# Patient Record
Sex: Female | Born: 1975 | Race: White | Hispanic: No | Marital: Single | Smoking: Current every day smoker
Health system: Southern US, Community
[De-identification: ages and names within clinical notes are randomized; demographics above are authoritative.]

## PROBLEM LIST (undated history)

## (undated) DIAGNOSIS — D496 Neoplasm of unspecified behavior of brain: Secondary | ICD-10-CM

## (undated) DIAGNOSIS — R569 Unspecified convulsions: Secondary | ICD-10-CM

## (undated) DIAGNOSIS — M549 Dorsalgia, unspecified: Secondary | ICD-10-CM

## (undated) DIAGNOSIS — F329 Major depressive disorder, single episode, unspecified: Secondary | ICD-10-CM

## (undated) DIAGNOSIS — I509 Heart failure, unspecified: Secondary | ICD-10-CM

## (undated) DIAGNOSIS — I10 Essential (primary) hypertension: Secondary | ICD-10-CM

## (undated) DIAGNOSIS — F32A Depression, unspecified: Secondary | ICD-10-CM

## (undated) HISTORY — PX: OTHER SURGICAL HISTORY: SHX169

## (undated) HISTORY — PX: TONSILLECTOMY: SUR1361

## (undated) HISTORY — PX: CHOLECYSTECTOMY: SHX55

## (undated) HISTORY — PX: BACK SURGERY: SHX140

## (undated) HISTORY — PX: BREAST LUMPECTOMY: SHX2

## (undated) HISTORY — PX: OVARIAN CYST SURGERY: SHX726

## (undated) HISTORY — PX: BRAIN SURGERY: SHX531

---

## 2013-09-12 ENCOUNTER — Emergency Department (HOSPITAL_COMMUNITY): Payer: Medicaid - Out of State

## 2013-09-12 ENCOUNTER — Encounter (HOSPITAL_COMMUNITY): Payer: Self-pay | Admitting: Emergency Medicine

## 2013-09-12 ENCOUNTER — Emergency Department (HOSPITAL_COMMUNITY)
Admission: EM | Admit: 2013-09-12 | Discharge: 2013-09-12 | Disposition: A | Discharge status: Home / Self Care | Payer: Medicaid - Out of State | Attending: Emergency Medicine | Admitting: Emergency Medicine

## 2013-09-12 DIAGNOSIS — I10 Essential (primary) hypertension: Secondary | ICD-10-CM | POA: Insufficient documentation

## 2013-09-12 DIAGNOSIS — F17200 Nicotine dependence, unspecified, uncomplicated: Secondary | ICD-10-CM | POA: Insufficient documentation

## 2013-09-12 DIAGNOSIS — Z760 Encounter for issue of repeat prescription: Secondary | ICD-10-CM | POA: Insufficient documentation

## 2013-09-12 DIAGNOSIS — E669 Obesity, unspecified: Secondary | ICD-10-CM | POA: Insufficient documentation

## 2013-09-12 DIAGNOSIS — Z9889 Other specified postprocedural states: Secondary | ICD-10-CM | POA: Insufficient documentation

## 2013-09-12 DIAGNOSIS — M549 Dorsalgia, unspecified: Secondary | ICD-10-CM | POA: Insufficient documentation

## 2013-09-12 DIAGNOSIS — Z91040 Latex allergy status: Secondary | ICD-10-CM | POA: Insufficient documentation

## 2013-09-12 DIAGNOSIS — G930 Cerebral cysts: Secondary | ICD-10-CM | POA: Insufficient documentation

## 2013-09-12 DIAGNOSIS — F411 Generalized anxiety disorder: Secondary | ICD-10-CM | POA: Insufficient documentation

## 2013-09-12 DIAGNOSIS — I509 Heart failure, unspecified: Secondary | ICD-10-CM | POA: Insufficient documentation

## 2013-09-12 DIAGNOSIS — Z880 Allergy status to penicillin: Secondary | ICD-10-CM | POA: Insufficient documentation

## 2013-09-12 DIAGNOSIS — G8929 Other chronic pain: Secondary | ICD-10-CM | POA: Insufficient documentation

## 2013-09-12 HISTORY — DX: Essential (primary) hypertension: I10

## 2013-09-12 HISTORY — DX: Dorsalgia, unspecified: M54.9

## 2013-09-12 HISTORY — DX: Unspecified convulsions: R56.9

## 2013-09-12 HISTORY — DX: Heart failure, unspecified: I50.9

## 2013-09-12 HISTORY — DX: Neoplasm of unspecified behavior of brain: D49.6

## 2013-09-12 LAB — DIFFERENTIAL
Basophils Absolute: 0.1 K/uL (ref 0.0–0.1)
Basophils Relative: 1 % (ref 0–1)
Eosinophils Absolute: 0.3 K/uL (ref 0.0–0.7)
Eosinophils Relative: 5 % (ref 0–5)
Lymphocytes Relative: 33 % (ref 12–46)
Lymphs Abs: 2.3 K/uL (ref 0.7–4.0)
Monocytes Absolute: 0.4 K/uL (ref 0.1–1.0)
Monocytes Relative: 5 % (ref 3–12)
Neutro Abs: 3.9 K/uL (ref 1.7–7.7)
Neutrophils Relative %: 56 % (ref 43–77)

## 2013-09-12 LAB — COMPREHENSIVE METABOLIC PANEL
ALBUMIN: 3.7 g/dL (ref 3.5–5.2)
ALT: 15 U/L (ref 0–35)
AST: 13 U/L (ref 0–37)
Alkaline Phosphatase: 133 U/L — ABNORMAL HIGH (ref 39–117)
BUN: 15 mg/dL (ref 6–23)
CALCIUM: 10.8 mg/dL — AB (ref 8.4–10.5)
CO2: 21 mEq/L (ref 19–32)
Chloride: 103 mEq/L (ref 96–112)
Creatinine, Ser: 0.86 mg/dL (ref 0.50–1.10)
GFR calc Af Amer: >90 mL/min (ref >90)
GFR calc non Af Amer: 85 mL/min — ABNORMAL LOW (ref >90)
Glucose, Bld: 86 mg/dL (ref 70–99)
Potassium: 4.9 mEq/L (ref 3.7–5.3)
SODIUM: 136 meq/L — AB (ref 137–147)
TOTAL PROTEIN: 7.5 g/dL (ref 6.0–8.3)
Total Bilirubin: 0.4 mg/dL (ref 0.3–1.2)

## 2013-09-12 LAB — CBC
HEMATOCRIT: 43.3 % (ref 36.0–46.0)
Hemoglobin: 14.9 g/dL (ref 12.0–15.0)
MCH: 32.2 pg (ref 26.0–34.0)
MCHC: 34.4 g/dL (ref 30.0–36.0)
MCV: 93.5 fL (ref 78.0–100.0)
PLATELETS: 211 10*3/uL (ref 150–400)
RBC: 4.63 MIL/uL (ref 3.87–5.11)
RDW: 12.8 % (ref 11.5–15.5)
WBC: 7 10*3/uL (ref 4.0–10.5)

## 2013-09-12 LAB — PROTIME-INR
INR: 0.99 (ref 0.00–1.49)
PROTHROMBIN TIME: 12.9 s (ref 11.6–15.2)

## 2013-09-12 LAB — APTT: APTT: 28 s (ref 24–37)

## 2013-09-12 MED ORDER — PHENYTOIN 50 MG PO CHEW
100.0000 mg | CHEWABLE_TABLET | ORAL | Status: AC
  Administered 2013-09-12: 100 mg via ORAL
  Filled 2013-09-12: qty 2

## 2013-09-12 MED ORDER — HYDROCODONE-ACETAMINOPHEN 5-325 MG PO TABS
1.0000 | ORAL_TABLET | Freq: Once | ORAL | Status: AC
  Administered 2013-09-12: 1 via ORAL
  Filled 2013-09-12: qty 1

## 2013-09-12 MED ORDER — LISINOPRIL 20 MG PO TABS: 20.0000 mg | ORAL_TABLET | Freq: Every day | ORAL | Status: DC

## 2013-09-12 MED ORDER — ACETAMINOPHEN 325 MG PO TABS: 650.0000 mg | ORAL_TABLET | Freq: Once | ORAL | Status: DC

## 2013-09-12 MED ORDER — SIMVASTATIN 40 MG PO TABS: 40.0000 mg | ORAL_TABLET | Freq: Every day | ORAL | Status: DC

## 2013-09-12 MED ORDER — PHENYTOIN SODIUM EXTENDED 100 MG PO CAPS: 100.0000 mg | ORAL_CAPSULE | Freq: Three times a day (TID) | ORAL | Status: DC
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
## 2013-09-12 NOTE — ED Notes (Signed)
Pt and SO expressing concerns about pt going home tonight.  States they are staying in motel and area not safe as pt is having difficulties walking and will have problem getting medications until at least several days.

## 2013-09-12 NOTE — Discharge Instructions (Signed)
Arachnoid Cysts Arachnoid cysts are cerebrospinal fluid-filled sacs. They may develop between the surface of the brain and the cranial base or on the arachnoid membrane. This is one of the 3 membranes that cover the brain and the spinal cord. Most cases begin during infancy. But onset may be delayed until adolescence. These cysts most often happen in males. SYMPTOMS  Problems (symptoms ) of an arachnoid cyst are related to the cyst size and location.  Small cysts are usually asymptomatic. This means they cause no symptoms. They are discovered only by chance.  Large cysts may cause cranial deformation or enlargement of the head (macrocephaly). These produce symptoms such as:  Headaches.  Convulsions (seizures).  Developmental delay.  Increased intracranial pressure.  Behavioral changes.  Excessive accumulation of cerebrospinal fluid (hydrocephalus). Other symptoms may include:  Weakness or paralysis on one side of the body (hemiparesis).  Lack of muscle control (ataxia). TREATMENT  Treatment for arachnoid cysts is symptomatic. When symptoms warrant, the surgical placement of a shunt may be required to remove pressure from (decompress) the cyst. If left untreated, arachnoid cysts may cause permanent, severe neurological damage. This is due to the progressive expansion of the cyst(s) or bleeding (hemorrhage). With treatment most individuals with arachnoid cysts do well. Document Released: 06/12/2002 Document Revised: 09/14/2011 Document Reviewed: 06/22/2005 [facility] Patient Information 2014 [place].   Emergency Department Resource Guide 1) Find a Doctor and Pay Out of Pocket Although you won't have to find out who is covered by your insurance plan, it is a good idea to ask around and get recommendations. You will then need to call the office and see if the doctor you have chosen will accept you as a new patient and what types of options they offer for patients who are self-pay.  Some doctors offer discounts or will set up payment plans for their patients who do not have insurance, but you will need to ask so you aren't surprised when you get to your appointment.  2) Contact Your Local Health Department Not all health departments have doctors that can see patients for sick visits, but many do, so it is worth a call to see if yours does. If you don't know where your local health department is, you can check in your phone book. The CDC also has a tool to help you locate your state's health department, and many state websites also have listings of all of their local health departments.  3) Find a [facility] If your illness is not likely to be very severe or complicated, you may want to try a walk in clinic. These are popping up all over the country in pharmacies, drugstores, and shopping centers. They're usually staffed by nurse practitioners or physician assistants that have been trained to treat common illnesses and complaints. They're usually fairly quick and inexpensive. However, if you have serious medical issues or chronic medical problems, these are probably not your best option.  No Primary Care Doctor: - Call Health Connect at  [phone] - they can help you locate a primary care doctor that  accepts your insurance, provides certain services, etc. - Physician Referral Service- [phone]  Chronic Pain Problems: Organization         Address  Phone   Notes  [facility]  [phone] Patients need to be referred by their primary care doctor.   Medication Assistance: Organization         Address  Phone   Notes  [facility]  Medication Assistance Program [address] [place], [place] [address] [phone] --Must be a resident of [facility] [place] -- Must have NO insurance coverage whatsoever (no Medicaid/ Medicare, etc.) -- The pt. MUST have a primary care doctor that directs their care regularly and follows them in the  community   MedAssist  [phone]   [employer]  [phone]    Agencies that provide inexpensive medical care: Organization         Address  Phone   Notes  [facility]  [phone]   [facility]    [phone]   [facility] [address] [place], [place] [address] [phone]   [facility] [address], [place] [phone]   Planned Parenthood    [phone]   [facility]    [phone]   [facility]  [address], [employer] Phone:  [phone], Fax:  [phone] Hours of Operation:  9 am - 6 pm, M-F.  Also accepts Medicaid/Medicare and self-pay.  [facility]  [address], [place] Phone: [phone]) [phone], Fax: [phone]) [phone]. Hours of Operation:  8:30 am - 5:30 pm, M-F.  Also accepts Medicaid and self-pay.  [facility] [place] [address], [place] Phone: [phone]) [phone]   [facility] [address], [place], [place] [phone], Ext. [phone] Mondays & Thursdays: 7-9 AM.  First 15 patients are seen on a first come, first serve basis.    Medicaid-accepting [facility] Providers:  Organization         Address  Phone   Notes  [facility] [address], [place] [phone]) [phone] Also accepts self-pay patients.  [facility] [address], [place]  [phone]   [facility] [address], [place] [phone]   [facility] [address] [place] [address], [place] [phone]   Renaye RakersVeita Bland [address], [place]   [phone] Only accepts [place] Access [place] patients after they have their name applied to their card.   Self-Pay (no insurance) in [facility]:  Organization         Address  Phone   Notes  Sickle Cell Patients, [facility] [address], [place] [phone]   [facility] [address], [place] [phone]) [phone]   [facility] [address]  [address] [place] [address], [place] [phone]   [facility]/Dr. Osei-Bonsu  [address] [place] [address], [place] or [phone], [place] [phone] Phone number for both [place] and [place] locations is the same.  [facility] [address], [place] [phone]) [phone]   [facility] [place] [address] [place] [address], [place] or [address] [phone]   [facility] [address], [place] [phone]) [phone], phone; [phone]) [phone], fax Sees patients 1st and 3rd Saturday of every month.  Must not qualify for public or private insurance (i.e. Medicaid, Medicare, [place] Health Choice, Veterans' Benefits)  Household income should be no more than 200% of the poverty level The clinic cannot treat you if you are pregnant or think you are pregnant  Sexually transmitted diseases are not treated  at the clinic.   Dental Care: Organization         Address  Phone  Notes  [facility] of [facility] [address] [place], [place] [phone] Accepts children up to age 36 who are enrolled in [place] or [place] Health Choice; pregnant women with a Medicaid card; and children who have applied for Medicaid or [place] Health Choice, but were declined, whose parents can pay a reduced fee at time of service.  [facility] Department of [facility]  [address], [place] [phone] Accepts children up to age 31 who are enrolled in [place] or [place] Health Choice; pregnant women with a Medicaid card; and children who have applied for Medicaid or [place] Health Choice, but were declined, whose parents can pay a reduced fee at time of service.  [facility]  [address] [place], [place] [phone] Patients are seen by appointment only. Walk-ins are not accepted. [facility] will see patients 57 years of age and older. Monday - Tuesday (8am-5pm) Most Wednesdays (8:30-5pm) $30 per visit, cash only  [facility]  [address], [facility] [phone] Patients are seen by appointment only. Walk-ins are not accepted. [facility] will see patients 59 years of age and older. One Wednesday Evening (Monthly: Volunteer Based).  $30 per visit, cash only  [employer]  [phone] for adults; Children under age 35, call Graduate Pediatric Dentistry at [phone]. Children aged 59-14, please call [phone] to request a pediatric application.  Dental services are provided in all areas of dental care including fillings, crowns and bridges, complete and partial dentures, implants, gum treatment, root canals, and extractions. Preventive care is also provided. Treatment is provided to both adults and children. Patients are selected via a lottery and there is often a waiting list.   [facility] [address] [place]  [phone] [email or web address]   [facility] [address] [place], [place] [phone], Ext. [phone] Second and Fourth Thursday of each month, opens at 6:30 AM; Clinic ends at 9 AM.  Patients are seen on a first-come first-served basis, and a limited number are seen during each clinic.   [facility]  [address] [place], [place] [phone]   Eligibility Requirements You must have lived in [place], [place], or [place] counties for at least the last three months.   You cannot be eligible for state or federal sponsored [place], including [employer], [place], or [employer].   You generally cannot be eligible for healthcare insurance through your employer.    How to apply: Eligibility screenings are held every  Tuesday and Wednesday afternoon from 1:00 pm until 4:00 pm. You do not need an appointment for the interview!  [facility] [address], [place], [place] [phone]   [facility] Health Department  [phone]   [facility] Health Department  [phone]   [facility] Health Department  [phone]    Behavioral Health Resources in the Community: Intensive Outpatient Programs Organization         Address  Phone  Notes  [facility] Services [address] [place], [place] [phone]   [facility] Outpatient [address] [place], [place] [phone]   [facility]: [facility] Drug S[facility]cs [address], [place], [place]  [phone]   [facility]  Health [address]  [place], [place] or [phone]   Substance Abuse Resources Organization         Address  Phone  Notes  Alcohol and Drug Services  [phone]   [place]  [phone]   The [place]   Chinita Pester  [phone]   Residential & Outpatient Substance Abuse Program  [phone]   Psychological Services Organization         Address  Phone  Notes  [facility] [place]  [place]  [phone]   [place] [address] [place] or [phone]    Mobile Crisis Teams Organization         Address  Phone  Notes  [facility], Mobile Crisis Care Unit  [phone]   [facility]  [address] [place], [place]   [place] Levels [address], [place] [phone]    Self-Help/Support Groups Organization         Address  Phone             Notes  [place]. of [place] - variety of support groups  [place] Call for more information  Narcotics Anonymous (NA), [facility] [address], [employer] [place]  2 meetings at this location    Special educational needs teacher         Address  Phone  Notes  ASAP Residential Treatment [place],    [place]  [phone]   [facility]  [address] [place] [phone], [place], [place]   [place], [place] [phone] Admissions: 8am-3pm M-F  Incentives Substance [place] [address]    [place], [place] [phone]   [facility] [address] [place], [place], [place]   [facility] [address] [place] [address]  [place], [place]   Insight Programs - Intensive Outpatient [address], [place], [place]   [facility] ([place].,  [place], [place] [phone] or [phone]   [facility] (RTS) [address] [place], [place] Accepts Medicaid  [facility] [place] [address],  [place] [phone] Substance Abuse/Addiction Treatment   [facility] Organization         Address  Phone  Notes  [facility]  [phone]   Domenic Schwab, PhD [address] [place]lis [place]a [place], [place]   [phone] or [phone]   [place]brook Pillager [place], [place] [phone]   [facility] [address], [place], [place] [phone] Insurance/Medicaid/sponsorship through [facility] and Families [address] [place]                                    [place], [place] [phone] [place] [address] [place], [place] [phone]    Dr. Adele Schilder  [phone]   [facility] Dept. 1) [address] [place] 2) [place] 3)  [address], [place] [phone]  [phone]   [place] [phone] or [phone] (After  Hours)

## 2013-09-12 NOTE — ED Notes (Signed)
Pt laying on right side, answers questions appropriately.

## 2013-09-12 NOTE — ED Notes (Signed)
Per pt and friend sts pt has a brain tumor and for the last few days has been experiencing pressure in her head and disorientation and speech issues. sts she has had the brain tumors for a long time. Normally pt has clear speech but has periods of episodes like this.

## 2013-09-12 NOTE — ED Notes (Signed)
Discussed case manager regarding homeless shelters in town.  Per case manage homeless shelters usually fill up quickly.  Advised pt and SO same, they state if they can make it until tomorrow they can someone to wire them money for a new place to stay.

## 2013-09-12 NOTE — ED Notes (Signed)
Case manager at bedside 

## 2013-09-12 NOTE — ED Notes (Signed)
IV attempt x 1, unsuccessful.  Raquel Sarna RN in room, not able to see viable vein.  IV team called.

## 2013-09-12 NOTE — ED Notes (Signed)
Pt is now talking and aware of surroundings.  Pt now being transported to CT.  IV team called back, will come see pt for labs and IV when pt returns from CT.

## 2013-09-12 NOTE — ED Notes (Signed)
Lab tech called nurse to room.  Pts SO states "she is having another spell".  Pt not wanting to verbalize and eyes looking around room.  MD made aware.

## 2013-09-12 NOTE — Progress Notes (Addendum)
ED CM consulted to meet with patient concerning medication issues. Pt presented to [facility] ED reporting running out of her seizure meds. In room to speak with patient. SO at bedside patient consented to discuss her care in his presence. Pt reports recently relocating from [place]. Her Medicaid has not been transferred to [facility] yet. Pt is currently staying in a [facility]el. However reports that they are not able to pay for their room tonight. SW made aware emergency homeless shelter resources given.  Pt is eligible for MATCH. Discussed MATCH program and the guidelines including the  $3 co-pay per prescription. Pt enrolled and [place] letter printed and given to patient with list of participating pharmacies.  Pt verbalized understanding and is agreeable with plan. Discussed disposition plan with Florentina Jenny RN and Dr. Tomi Bamberger  both in agreement with disposition plan. Patient has prescriptions with [place] letter to take to pharmacy. Pt voiced her appreciation. Reminded patient of the importance of following up with a PCP. Affordable Primary care list and [facility] List given. Pt interested in [facility] for f/u. Provided [facility] pamphlet  with contact information on establishing f/u care. Pt verbalized understanding teach back method used. No further ED CM needs identified.

## 2013-09-13 ENCOUNTER — Emergency Department (HOSPITAL_COMMUNITY)
Admission: EM | Admit: 2013-09-13 | Discharge: 2013-09-13 | Disposition: A | Discharge status: Home / Self Care | Payer: Medicaid - Out of State | Attending: Emergency Medicine | Admitting: Emergency Medicine

## 2013-09-13 ENCOUNTER — Encounter (HOSPITAL_COMMUNITY): Payer: Self-pay | Admitting: Emergency Medicine

## 2013-09-13 ENCOUNTER — Emergency Department (HOSPITAL_COMMUNITY): Payer: Self-pay

## 2013-09-13 DIAGNOSIS — R519 Headache, unspecified: Secondary | ICD-10-CM

## 2013-09-13 DIAGNOSIS — F17200 Nicotine dependence, unspecified, uncomplicated: Secondary | ICD-10-CM | POA: Insufficient documentation

## 2013-09-13 DIAGNOSIS — R51 Headache: Secondary | ICD-10-CM | POA: Insufficient documentation

## 2013-09-13 DIAGNOSIS — Z79899 Other long term (current) drug therapy: Secondary | ICD-10-CM | POA: Insufficient documentation

## 2013-09-13 DIAGNOSIS — R5381 Other malaise: Secondary | ICD-10-CM | POA: Insufficient documentation

## 2013-09-13 DIAGNOSIS — I10 Essential (primary) hypertension: Secondary | ICD-10-CM | POA: Insufficient documentation

## 2013-09-13 DIAGNOSIS — Z91040 Latex allergy status: Secondary | ICD-10-CM | POA: Insufficient documentation

## 2013-09-13 DIAGNOSIS — R5383 Other fatigue: Secondary | ICD-10-CM

## 2013-09-13 DIAGNOSIS — H53149 Visual discomfort, unspecified: Secondary | ICD-10-CM | POA: Insufficient documentation

## 2013-09-13 DIAGNOSIS — R209 Unspecified disturbances of skin sensation: Secondary | ICD-10-CM | POA: Insufficient documentation

## 2013-09-13 DIAGNOSIS — I509 Heart failure, unspecified: Secondary | ICD-10-CM | POA: Insufficient documentation

## 2013-09-13 DIAGNOSIS — G40909 Epilepsy, unspecified, not intractable, without status epilepticus: Secondary | ICD-10-CM | POA: Insufficient documentation

## 2013-09-13 DIAGNOSIS — Z880 Allergy status to penicillin: Secondary | ICD-10-CM | POA: Insufficient documentation

## 2013-09-13 MED ORDER — TRAMADOL HCL 50 MG PO TABS
50.0000 mg | ORAL_TABLET | Freq: Once | ORAL | Status: AC
  Administered 2013-09-13: 50 mg via ORAL
  Filled 2013-09-13: qty 1

## 2013-09-13 MED ORDER — TRAMADOL HCL 50 MG PO TABS: 50.0000 mg | ORAL_TABLET | Freq: Four times a day (QID) | ORAL | Status: DC | PRN

## 2013-09-13 MED ORDER — METOCLOPRAMIDE HCL 10 MG PO TABS: 10.0000 mg | ORAL_TABLET | Freq: Four times a day (QID) | ORAL | Status: DC | PRN

## 2013-09-13 MED ORDER — METOCLOPRAMIDE HCL 10 MG PO TABS
10.0000 mg | ORAL_TABLET | Freq: Once | ORAL | Status: AC
  Administered 2013-09-13: 10 mg via ORAL
  Filled 2013-09-13: qty 1

## 2013-09-13 NOTE — ED Notes (Addendum)
Pt states that she was hear earlier tonight for the same. Pt states she has been having a headache for several days.  Pt states she was having weakness on her left side earlier, but not currently.  Pt had CT completed with previous visit.

## 2013-09-13 NOTE — ED Notes (Signed)
When asked to squeeze RN's hands for neuro assessment, patient could not squeeze with left hand, however, patient has freedom of movement in all extremities and is using phone with left hand.

## 2013-09-13 NOTE — Discharge Instructions (Signed)
Call and make appointment with neurology for ongoing headaches. Establish primary care doctor to follow your chronic medical conditions regularly. Return to emergency department for any worsening symptoms or concerns.   Emergency Department Resource Guide 1) Find a Doctor and Pay Out of Pocket Although you won't have to find out who is covered by your insurance plan, it is a good idea to ask around and get recommendations. You will then need to call the office and see if the doctor you have chosen will accept you as a new patient and what types of options they offer for patients who are self-pay. Some doctors offer discounts or will set up payment plans for their patients who do not have insurance, but you will need to ask so you aren't surprised when you get to your appointment.  2) Contact Your Local Health Department Not all health departments have doctors that can see patients for sick visits, but many do, so it is worth a call to see if yours does. If you don't know where your local health department is, you can check in your phone book. The CDC also has a tool to help you locate your state's health department, and many state websites also have listings of all of their local health departments.  3) Find a [facility] If your illness is not likely to be very severe or complicated, you may want to try a walk in clinic. These are popping up all over the country in pharmacies, drugstores, and shopping centers. They're usually staffed by nurse practitioners or physician assistants that have been trained to treat common illnesses and complaints. They're usually fairly quick and inexpensive. However, if you have serious medical issues or chronic medical problems, these are probably not your best option.  No Primary Care Doctor: - Call Health Connect at  [phone] - they can help you locate a primary care doctor that  accepts your insurance, provides certain services, etc. - Physician Referral Service-  [phone]  Chronic Pain Problems: Organization         Address  Phone   Notes  [facility]  [phone] Patients need to be referred by their primary care doctor.   Medication Assistance: Organization         Address  Phone   Notes  [facility] Medication [facility] [address] [place], [place] [address] [phone] --Must be a resident of [facility] -- Must have NO insurance coverage whatsoever (no Medicaid/ Medicare, etc.) -- The pt. MUST have a primary care doctor that directs their care regularly and follows them in the community   MedAssist  [phone]   [employer]  [phone]    Agencies that provide inexpensive medical care: Organization         Address  Phone   Notes  [place]  [phone]   [facility]    [phone]   [facility] [place], [place] [address] [phone]   [place] [address], [place] [phone]   Planned Parenthood    [phone]   [facility]    [phone]   [address], [place] Phone:  [phone], Fax:  [phone] Hours of Operation:  9 am - 6 pm, M-F.  Also accepts Medicaid/Medicare and self-pay.  [facility] for [place]  [address], [place] Phone: [phone], Fax: [phone]. Hours of Operation:  8:30 am - 5:30 pm, M-F.  Also accepts Medicaid and self-pay.  [facility] [address] [place] Phone: [phone]   [place], [place], [place] [phone], Ext. [phone] Mondays & Thursdays: 7-9 AM.  First 15 patients are seen on a first come, first serve basis.    [place] Providers:  Organization         Address  Phone   Notes  [facility] [address],  [place] [phone] Also accepts self-pay patients.  [facility] [address], [place]  [phone]   [place], [address], [place] [phone]   [facility] [address] [place] [phone]   Lucianne Lei [address], [place]   [phone] Only accepts [place] Access [place] patients after they have their name applied to their card.   Self-Pay (no insurance) in [facility]:  Organization         Address  Phone   Notes  Sickle Cell Patients, [place] [facility] Internal Medicine [place] [phone]   [facility] Urgent Care [place] [phone]   [facility] [place]  [place], [place], [place] [phone]   [facility]/Dr. Osei-Bonsu  [address] [place] or [address], [place] [phone] Phone number for both [place] and [place] locations is the same.  Urgent Medical and [facility] [address], [place] [phone]   [facility] [address] [place] or [address] [phone]   [facility] [address] [place] [phone], phone; [phone], fax Sees patients 1st and 3rd Saturday of every month.  Must not qualify for public or private insurance (i.e. Medicaid, Medicare, [place] Health Choice, Veterans' Benefits)  Household income should be no more than 200% of the poverty level The clinic cannot treat you if you are pregnant or think you are pregnant  Sexually transmitted diseases are not treated at the clinic.    Dental Care: Organization         Address  Phone  Notes  [facility] [place] Department of [facility] [phone] Accepts children up to age 11 who are enrolled in  [place] or [place]; pregnant women with a Medicaid card; and children who have applied for Medicaid or [place] Health Choice, but were declined, whose parents can pay a reduced fee at time of service.  [facility] Department of [facility]  [address], [place] [phone] Accepts children up to age 74 who are enrolled in [place] or [place]; pregnant women with a Medicaid card; and children who have applied for Medicaid or [place] Health Choice, but were declined, whose parents can pay a reduced fee at time of service.  [place] Adult Dental Access PROGRAM  [place] [phone] Patients are seen by appointment only. Walk-ins are not accepted. [place] will see patients 38 years of age and older. Monday - Tuesday (8am-5pm) Most Wednesdays (8:30-5pm) $30 per visit, cash only  [place]  PROGRAM  [address], [facility] [phone] Patients are seen by appointment only. Walk-ins are not accepted. [place] will see patients 72 years of age and older. One Wednesday Evening (Monthly: Volunteer Based).  $30 per visit, cash only  [place]  [phone] for adults; Children under age 63, call Graduate Pediatric Dentistry at [phone]. Children aged 75-14, please call [phone] to request a pediatric application.  Dental services are provided in all areas of dental care including fillings, crowns and bridges, complete and partial dentures, implants, gum treatment, root canals, and extractions. Preventive care is also provided. Treatment is provided to both adults and children. Patients are selected via a lottery and there is often a waiting list.   [facility] [address] [place]  [phone] [email or web address]   [facility] [address], [place] [phone], Ext. [phone] Second and Fourth Thursday of each month, opens at 6:30  AM; Clinic ends at 9 AM.  Patients are seen on a first-come first-served basis, and a limited number are seen during each clinic.   [facility]  [address] Hillard Danker [place], [place] [phone]   Eligibility Requirements You must have lived in [place], [place], or [place] counties for at least the last three months.   You cannot be eligible for state or federal sponsored [employer], including [employer], [place], or [employer].   You generally cannot be eligible for healthcare insurance through your employer.    How to apply: Eligibility screenings are held every Tuesday and Wednesday afternoon from 1:00 pm until 4:00 pm. You do not need an appointment for the interview!  [facility] [address] [place], [place]   [place]  [facility] Department  [place]  [phone]    Behavioral Health Resources in the Community: Intensive Outpatient Programs Organization         Address  Phone  Notes  [place]emet Warren. [address], [place], [place] [phone]   [facility] Outpatient [address], [place], [place]   ADS: Alcohol & Drug Svcs [address] [place], [place]   [place] [address]  [place], [place] or [phone]   Substance Abuse Resources Organization         Address  Phone  Notes  Alcohol and Drug Services  [phone]   [place]  [phone]   The [place]   Chinita Pester  [phone]   Residential & Outpatient Substance Abuse Program  [phone]   Psychological Services Organization         Address  Phone  Notes  [facility] [place]  [place]  [phone]   [place] [address], [place] or  [phone]    Mobile Crisis Teams Organization         Address  Phone  Notes  Therapeutic Alternatives, Mobile Crisis Care Unit  [phone]   Assertive Psychotherapeutic Services  [address] [place], [place]   [place] [address], [place] [phone]    Self-Help/Support Groups Organization         Address  Phone             Notes  [place].  of [place] - variety of support groups  [place] Call for more information  Narcotics Anonymous (NA), [facility]aring [facility] [address], [employer] [place]  2 meetings at this location   Special educational needs teacher         Address  Phone  Notes  ASAP Residential Treatment [place],    [place]  [phone]   [facility]  [address] [place] [address]166, [place], [place]   [place], [place] [phone] Admissions: 8am-3pm M-F  Incentives Substance [place] [address]    [place], [place] [phone]   [facility] [address] [place], [place], [place]   [facility] [address]  [place], [place]   Insight Programs - Intensive Outpatient [address] Kristeen [address], [place], [place]   [facility] [place].,  [place], [place] [phone] or [phone]   [facility]idential Treatment Services (RTS) [address] [place], [place] Accepts Medicaid  [facility] [place] [address]  [place] [phone] Substance Abuse/Addiction Treatment   [facility] Organization         Address  Phone  Notes  [facility]  [phone]   Domenic Schwab, PhD [address] Arlis Porta [place], [place]   [phone] or [phone]   [place], [place] [phone]   [facility] [address],  [place], [place] [phone] Insurance/Medicaid/sponsorship through [facility] and Families [address] [place]                                    [place], [place] [phone] [place] [address], [place] [phone]    Dr. Adele Schilder  [phone]   [facility] Dept. 1) [address] [place] 2) [place] 3)  [address], [place] [phone]  [phone]   [place] [phone] or [phone] (After Hours)
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
## 2013-09-14 ENCOUNTER — Encounter (HOSPITAL_COMMUNITY): Payer: Self-pay | Admitting: *Deleted

## 2013-09-14 ENCOUNTER — Inpatient Hospital Stay (HOSPITAL_COMMUNITY): Payer: Medicaid - Out of State

## 2013-09-14 ENCOUNTER — Inpatient Hospital Stay (HOSPITAL_COMMUNITY)
Admission: AD | Admit: 2013-09-14 | Discharge: 2013-09-14 | Disposition: A | Discharge status: Home / Self Care | Payer: Medicaid - Out of State | Source: Ambulatory Visit | Attending: Obstetrics & Gynecology | Admitting: Obstetrics & Gynecology

## 2013-09-14 DIAGNOSIS — I10 Essential (primary) hypertension: Secondary | ICD-10-CM | POA: Insufficient documentation

## 2013-09-14 DIAGNOSIS — N7011 Chronic salpingitis: Secondary | ICD-10-CM

## 2013-09-14 DIAGNOSIS — R197 Diarrhea, unspecified: Secondary | ICD-10-CM | POA: Insufficient documentation

## 2013-09-14 DIAGNOSIS — R1110 Vomiting, unspecified: Secondary | ICD-10-CM | POA: Insufficient documentation

## 2013-09-14 DIAGNOSIS — N7013 Chronic salpingitis and oophoritis: Secondary | ICD-10-CM | POA: Insufficient documentation

## 2013-09-14 DIAGNOSIS — F17200 Nicotine dependence, unspecified, uncomplicated: Secondary | ICD-10-CM | POA: Insufficient documentation

## 2013-09-14 DIAGNOSIS — I509 Heart failure, unspecified: Secondary | ICD-10-CM | POA: Insufficient documentation

## 2013-09-14 LAB — URINALYSIS, ROUTINE W REFLEX MICROSCOPIC
GLUCOSE, UA: NEGATIVE mg/dL
Ketones, ur: 15 mg/dL — AB
Leukocytes, UA: NEGATIVE
Nitrite: NEGATIVE
PROTEIN: NEGATIVE mg/dL
Specific Gravity, Urine: >1.03 — ABNORMAL HIGH (ref 1.005–1.030)
Urobilinogen, UA: 0.2 mg/dL (ref 0.0–1.0)
pH: 5 (ref 5.0–8.0)

## 2013-09-14 LAB — POCT PREGNANCY, URINE: Preg Test, Ur: NEGATIVE

## 2013-09-14 LAB — WET PREP, GENITAL
Clue Cells Wet Prep HPF POC: NONE SEEN
TRICH WET PREP: NONE SEEN
YEAST WET PREP: NONE SEEN

## 2013-09-14 LAB — URINE MICROSCOPIC-ADD ON

## 2013-09-14 MED ORDER — DOXYCYCLINE HYCLATE 100 MG PO TABS
100.0000 mg | ORAL_TABLET | Freq: Once | ORAL | Status: AC
  Administered 2013-09-14: 100 mg via ORAL
  Filled 2013-09-14: qty 1

## 2013-09-14 MED ORDER — DOXYCYCLINE HYCLATE 50 MG PO CAPS: 100.0000 mg | ORAL_CAPSULE | Freq: Two times a day (BID) | ORAL | Status: DC

## 2013-09-14 MED ORDER — ALBUTEROL SULFATE (2.5 MG/3ML) 0.083% IN NEBU
2.5000 mg | INHALATION_SOLUTION | Freq: Once | RESPIRATORY_TRACT | Status: AC
  Administered 2013-09-14: 2.5 mg via RESPIRATORY_TRACT
  Filled 2013-09-14: qty 3

## 2013-09-14 MED ORDER — HYDROMORPHONE HCL PF 1 MG/ML IJ SOLN: 1.0000 mg | Freq: Once | INTRAMUSCULAR | Status: DC

## 2013-09-14 MED ORDER — CEFTRIAXONE SODIUM 250 MG IJ SOLR
250.0000 mg | Freq: Once | INTRAMUSCULAR | Status: AC
  Administered 2013-09-14: 250 mg via INTRAMUSCULAR
  Filled 2013-09-14: qty 250

## 2013-09-14 MED ORDER — OXYCODONE-ACETAMINOPHEN 5-325 MG PO TABS
1.0000 | ORAL_TABLET | Freq: Once | ORAL | Status: AC
  Administered 2013-09-14: 2 via ORAL
  Filled 2013-09-14: qty 2

## 2013-09-14 MED ORDER — DIPHENHYDRAMINE HCL 25 MG PO CAPS
25.0000 mg | ORAL_CAPSULE | Freq: Once | ORAL | Status: AC
  Administered 2013-09-14: 25 mg via ORAL
  Filled 2013-09-14: qty 1

## 2013-09-14 NOTE — MAU Note (Signed)
Patient states she has been [place] ovarian cysts in other states. States she has been having vomiting for about 1 1/2 weeks, diarrhea for 3-4 days and bleeding for the past 2 1/2 weeks. Has been having a pulling abdominal pain that is there all the time.

## 2013-09-14 NOTE — MAU Note (Signed)
Pt states she has been having abdominal of and on and has gotten steady the last couple of days

## 2013-09-14 NOTE — MAU Provider Note (Signed)
History     CSN: [id number]  Arrival date and time: 09/14/13 1431   None     Chief Complaint  Patient presents with  . Emesis  . Diarrhea  . Abdominal Pain  . Vaginal Bleeding   HPI  RN Patient states she has been diagnosed with ovarian cysts in other states. States she has been having vomiting for about 1 1/2 weeks, diarrhea for 3-4 days and bleeding for the past 2 1/2 weeks. Has been having a pulling abdominal pain that is there all the time. She has been to ER twice in the last week and gotten Ultram and Norco which she says she has not filled. Daily smoker which wheezes and getting over a cold for last 2 weeks.   Past Medical History  Diagnosis Date  . Brain tumor   . Seizures   . Back pain   . Hypertension   . CHF (congestive heart failure)     Past Surgical History  Procedure Laterality Date  . Brain surgery      No family history on file.  History  Substance Use Topics  . Smoking status: Current Every Day Smoker    Types: Cigarettes  . Smokeless tobacco: Not on file  . Alcohol Use: No    Allergies:  Allergies  Allergen Reactions  . Bee Venom Anaphylaxis  . Penicillins Anaphylaxis  . Toradol [Ketorolac Tromethamine] Anaphylaxis  . Latex Hives  . Morphine And Related Other (See Comments)    Causes Cramping.  Marland Kitchen Peach [Prunus Persica] Swelling  . Topamax [Topiramate] Hives and Nausea And Vomiting    Prescriptions prior to admission  Medication Sig Dispense Refill  . alprazolam (XANAX) 2 MG tablet Take 2 mg by mouth at bedtime as needed for sleep.      Marland Kitchen EPINEPHrine (EPIPEN) 0.3 mg/0.3 mL SOAJ injection Inject 0.3 mg into the muscle once.      Marland Kitchen HYDROmorphone (DILAUDID) 4 MG tablet Take 4 mg by mouth every 6 (six) hours as needed for severe pain.      Marland Kitchen lisinopril (PRINIVIL,ZESTRIL) 20 MG tablet Take 1 tablet (20 mg total) by mouth daily.  30 tablet  1  . metoCLOPramide (REGLAN) 10 MG tablet Take 1 tablet (10 mg total) by mouth every 6 (six) hours  as needed for nausea (nausea/headache).  6 tablet  0  . phenytoin (DILANTIN) 100 MG ER capsule Take 1 capsule (100 mg total) by mouth 3 (three) times daily.  90 capsule  1  . simvastatin (ZOCOR) 40 MG tablet Take 1 tablet (40 mg total) by mouth daily.  30 tablet  1  . traMADol (ULTRAM) 50 MG tablet Take 1 tablet (50 mg total) by mouth every 6 (six) hours as needed.  15 tablet  0    Review of Systems  Constitutional: Negative.   Respiratory: Positive for cough and wheezing.   Gastrointestinal: Positive for abdominal pain.  Skin: Negative.   Neurological: Positive for headaches.  Psychiatric/Behavioral: Negative.    Physical Exam   Blood pressure 135/90, pulse 91, temperature 97.9 F (36.6 C), temperature source Oral, resp. rate 19, height 5\' 7"  (1.702 m), weight 118.842 kg (262 lb), last menstrual period 09/03/2013, SpO2 97.00%.  Physical Exam  Constitutional: She is oriented to person, place, and time. She appears well-developed and well-nourished. No distress.  Unkempt, dirty, smells smoke  HENT:  Head: Normocephalic and atraumatic.  Eyes: Pupils are equal, round, and reactive to light.  Cardiovascular: Normal rate, regular rhythm and normal  heart sounds.   Respiratory: No respiratory distress. She has wheezes. She has no rales. She exhibits no tenderness.  GI: Soft. Bowel sounds are normal. She exhibits distension. She exhibits no mass. There is no tenderness. There is no rebound and no guarding.  obese  Genitourinary:   External: no lesion Vagina: small amount of white discharge Cervix: pink, smooth, +CMT  Uterus: NSSC Adnexa: NT   Musculoskeletal: Normal range of motion.  Neurological: She is alert and oriented to person, place, and time.  Skin: Skin is warm and dry.    MAU Course  Procedures  MDM  Pelvic Ultrasound Hand held Nebulizer with albuterol  US Transvaginal Non-ob  09/14/2013   CLINICAL DATA:  Vaginal bleeding  EXAM: TRANSABDOMINAL AND TRANSVAGINAL  ULTRASOUND OF PELVIS  TECHNIQUE: Both transabdominal and transvaginal ultrasound examinations of the pelvis were performed. Transabdominal technique was performed for global imaging of the pelvis including uterus, ovaries, adnexal regions, and pelvic cul-de-sac. It was necessary to proceed with endovaginal exam following the transabdominal exam to visualize the uterus and adnexae in better detail.  COMPARISON:  None  FINDINGS: Uterus  Measurements: 7.0 x 3.3 x 4.0 cm. No fibroids or other mass visualized. Normal appearance by ultrasound.  Endometrium  Thickness: 3.4 mm.  No focal abnormality visualized.  Right ovary  Not visualized  Left ovary  Not visualized  Other findings  Left adnexa dilated tubular structure noted which appears to extend across the midline, nonspecific in appearance but suggests a hydrosalpinx.  No dependent free fluid.  IMPRESSION: Normal uterus and endometrium  Nonvisualization ovaries  Dilated fluid-filled tubular structure within the pelvis, suspect hydrosalpinx.   Electronically Signed   By: Daryll Brod M.D.   On: 09/14/2013 18:36   US Pelvis Complete  09/14/2013   CLINICAL DATA:  Vaginal bleeding  EXAM: TRANSABDOMINAL AND TRANSVAGINAL ULTRASOUND OF PELVIS  TECHNIQUE: Both transabdominal and transvaginal ultrasound examinations of the pelvis were performed. Transabdominal technique was performed for global imaging of the pelvis including uterus, ovaries, adnexal regions, and pelvic cul-de-sac. It was necessary to proceed with endovaginal exam following the transabdominal exam to visualize the uterus and adnexae in better detail.  COMPARISON:  None  FINDINGS: Uterus  Measurements: 7.0 x 3.3 x 4.0 cm. No fibroids or other mass visualized. Normal appearance by ultrasound.  Endometrium  Thickness: 3.4 mm.  No focal abnormality visualized.  Right ovary  Not visualized  Left ovary  Not visualized  Other findings  Left adnexa dilated tubular structure noted which appears to extend across the  midline, nonspecific in appearance but suggests a hydrosalpinx.  No dependent free fluid.  IMPRESSION: Normal uterus and endometrium  Nonvisualization ovaries  Dilated fluid-filled tubular structure within the pelvis, suspect hydrosalpinx.   Electronically Signed   By: Daryll Brod M.D.   On: 09/14/2013 18:36     Assessment and Plan   Care transferred to [place] at 5 pm  1. Hydrosalpinx    Treated with rocephin here in MAU Rx: doxycycline 100mg  BID X 14 FU in the clinic in 2-3 weeks Patient may fill pain med RX that she has not filled or take ibuprofen for the pain Return to MAU as needed  Follow-up Information   Follow up with [facility] In 3 weeks.   Specialty:  Obstetrics and Gynecology   Contact information:   [place] [id number] [phone]       Sherolyn Buba 09/14/2013, 4:17 PM

## 2013-09-14 NOTE — Discharge Instructions (Signed)
Pelvic Inflammatory Disease Pelvic inflammatory disease (PID) refers to an infection in some or all of the female organs. The infection can be in the uterus, ovaries, fallopian tubes, or the surrounding tissues in the pelvis. PID can cause abdominal or pelvic pain that comes on suddenly (acute pelvic pain). PID is a serious infection because it can lead to lasting (chronic) pelvic pain or the inability to have children (infertile).  CAUSES  The infection is often caused by the normal bacteria found in the vaginal tissues. PID may also be caused by an infection that is spread during sexual contact. PID can also occur following:   The birth of a baby.   A miscarriage.   An abortion.   Major pelvic surgery.   The use of an intrauterine device (IUD).   A sexual assault.  RISK FACTORS Certain factors can put a person at higher risk for PID, such as:  Being younger than 25 years.  Being sexually active at [place] age.  Usingnonbarrier contraception.  Havingmultiple sexual partners.  Having sex with someone who has symptoms of a genital infection.  Using oral contraception. Other times, certain behaviors can increase the possibility of getting PID, such as:  Having sex during your period.  Using a vaginal douche.  Having an intrauterine device (IUD) in place. SYMPTOMS   Abdominal or pelvic pain.   Fever.   Chills.   Abnormal vaginal discharge.  Abnormal uterine bleeding.   Unusual pain shortly after finishing your period. DIAGNOSIS  Your caregiver will choose some of the following methods to make a diagnosis, such as:   Performinga physical exam and history. A pelvic exam typically reveals a very tender uterus and surrounding pelvis.   Ordering laboratory tests including a pregnancy test, blood tests, and urine test.  Orderingcultures of the vagina and cervix to check for a sexually transmitted infection (STI).  Performing an ultrasound.    Performing a laparoscopic procedure to look inside the pelvis.  TREATMENT   Antibiotic medicines may be prescribed and taken by mouth.   Sexual partners may be treated when the infection is caused by a sexually transmitted disease (STD).   Hospitalization may be needed to give antibiotics intravenously.  Surgery may be needed, but this is rare. It may take weeks until you are completely well. If you are diagnosed with PID, you should also be checked for human immunodeficiency virus (HIV). HOME CARE INSTRUCTIONS   If given, take your antibiotics as directed. Finish the medicine even if you start to feel better.   Only take over-the-counter or prescription medicines for pain, discomfort, or fever as directed by your caregiver.   Do not have sexual intercourse until treatment is completed or as directed by your caregiver. If PID is confirmed, your recent sexual partner(s) will need treatment.   Keep your follow-up appointments. SEEK MEDICAL CARE IF:   You have increased or abnormal vaginal discharge.   You need prescription medicine for your pain.   You vomit.   You cannot take your medicines.   Your partner has an STD.  SEEK IMMEDIATE MEDICAL CARE IF:   You have a fever.   You have increased abdominal or pelvic pain.   You have chills.   You have pain when you urinate.   You are not better after 72 hours following treatment.  MAKE SURE YOU:   Understand these instructions.  Will watch your condition.  Will get help right away if you are not doing well or get worse.  pelvic pain.    · You have chills.    · You have pain when you urinate.    · You are not better after 72 hours following treatment.    MAKE SURE YOU:   · Understand these instructions.  · Will watch your condition.  · Will get help right away if you are not doing well or get worse.  Document Released: 06/22/2005 Document Revised: 10/17/2012 Document Reviewed: 06/18/2011  [employer]® Patient Information ©2014 [employer].

## 2013-09-15 ENCOUNTER — Inpatient Hospital Stay (HOSPITAL_COMMUNITY)
Admission: AD | Admit: 2013-09-15 | Discharge: 2013-09-20 | DRG: 885 | Disposition: A | Discharge status: Home / Self Care | Payer: Medicaid - Out of State | Source: Intra-hospital | Attending: Psychiatry | Admitting: Psychiatry

## 2013-09-15 ENCOUNTER — Encounter (HOSPITAL_COMMUNITY): Payer: Self-pay | Admitting: Emergency Medicine

## 2013-09-15 ENCOUNTER — Emergency Department (HOSPITAL_COMMUNITY)
Admission: EM | Admit: 2013-09-15 | Discharge: 2013-09-15 | Disposition: A | Discharge status: Home / Self Care | Payer: Medicaid - Out of State | Attending: Emergency Medicine | Admitting: Emergency Medicine

## 2013-09-15 ENCOUNTER — Encounter (HOSPITAL_COMMUNITY): Payer: Self-pay | Admitting: *Deleted

## 2013-09-15 DIAGNOSIS — F411 Generalized anxiety disorder: Secondary | ICD-10-CM | POA: Diagnosis present

## 2013-09-15 DIAGNOSIS — Z598 Other problems related to housing and economic circumstances: Secondary | ICD-10-CM

## 2013-09-15 DIAGNOSIS — I509 Heart failure, unspecified: Secondary | ICD-10-CM | POA: Diagnosis present

## 2013-09-15 DIAGNOSIS — Z5987 Material hardship due to limited financial resources, not elsewhere classified: Secondary | ICD-10-CM

## 2013-09-15 DIAGNOSIS — R45851 Suicidal ideations: Secondary | ICD-10-CM | POA: Diagnosis not present

## 2013-09-15 DIAGNOSIS — Z5900 Homelessness unspecified: Secondary | ICD-10-CM

## 2013-09-15 DIAGNOSIS — I10 Essential (primary) hypertension: Secondary | ICD-10-CM | POA: Diagnosis present

## 2013-09-15 DIAGNOSIS — F4312 Post-traumatic stress disorder, chronic: Secondary | ICD-10-CM | POA: Diagnosis present

## 2013-09-15 DIAGNOSIS — F4310 Post-traumatic stress disorder, unspecified: Secondary | ICD-10-CM | POA: Diagnosis present

## 2013-09-15 DIAGNOSIS — F332 Major depressive disorder, recurrent severe without psychotic features: Secondary | ICD-10-CM | POA: Diagnosis present

## 2013-09-15 DIAGNOSIS — Z5989 Other problems related to housing and economic circumstances: Secondary | ICD-10-CM | POA: Diagnosis not present

## 2013-09-15 DIAGNOSIS — F17200 Nicotine dependence, unspecified, uncomplicated: Secondary | ICD-10-CM | POA: Diagnosis present

## 2013-09-15 DIAGNOSIS — F1021 Alcohol dependence, in remission: Secondary | ICD-10-CM

## 2013-09-15 DIAGNOSIS — IMO0002 Reserved for concepts with insufficient information to code with codable children: Secondary | ICD-10-CM | POA: Diagnosis not present

## 2013-09-15 DIAGNOSIS — Z880 Allergy status to penicillin: Secondary | ICD-10-CM | POA: Insufficient documentation

## 2013-09-15 DIAGNOSIS — Z792 Long term (current) use of antibiotics: Secondary | ICD-10-CM | POA: Insufficient documentation

## 2013-09-15 DIAGNOSIS — J150 Pneumonia due to Klebsiella pneumoniae: Secondary | ICD-10-CM | POA: Insufficient documentation

## 2013-09-15 DIAGNOSIS — Z85841 Personal history of malignant neoplasm of brain: Secondary | ICD-10-CM | POA: Insufficient documentation

## 2013-09-15 DIAGNOSIS — F329 Major depressive disorder, single episode, unspecified: Secondary | ICD-10-CM | POA: Insufficient documentation

## 2013-09-15 DIAGNOSIS — G40909 Epilepsy, unspecified, not intractable, without status epilepticus: Secondary | ICD-10-CM | POA: Insufficient documentation

## 2013-09-15 DIAGNOSIS — F3289 Other specified depressive episodes: Secondary | ICD-10-CM | POA: Insufficient documentation

## 2013-09-15 DIAGNOSIS — Z91040 Latex allergy status: Secondary | ICD-10-CM | POA: Insufficient documentation

## 2013-09-15 DIAGNOSIS — Z79899 Other long term (current) drug therapy: Secondary | ICD-10-CM | POA: Insufficient documentation

## 2013-09-15 HISTORY — DX: Major depressive disorder, single episode, unspecified: F32.9

## 2013-09-15 HISTORY — DX: Depression, unspecified: F32.A

## 2013-09-15 LAB — COMPREHENSIVE METABOLIC PANEL
ALBUMIN: 4 g/dL (ref 3.5–5.2)
ALT: 18 U/L (ref 0–35)
AST: 18 U/L (ref 0–37)
Alkaline Phosphatase: 148 U/L — ABNORMAL HIGH (ref 39–117)
BUN: 15 mg/dL (ref 6–23)
CO2: 23 mEq/L (ref 19–32)
CREATININE: 0.7 mg/dL (ref 0.50–1.10)
Calcium: 11 mg/dL — ABNORMAL HIGH (ref 8.4–10.5)
Chloride: 101 mEq/L (ref 96–112)
GFR calc Af Amer: >90 mL/min (ref >90)
Glucose, Bld: 93 mg/dL (ref 70–99)
Potassium: 4.2 mEq/L (ref 3.7–5.3)
Sodium: 139 mEq/L (ref 137–147)
Total Bilirubin: 0.7 mg/dL (ref 0.3–1.2)
Total Protein: 7.3 g/dL (ref 6.0–8.3)

## 2013-09-15 LAB — SALICYLATE LEVEL

## 2013-09-15 LAB — RAPID URINE DRUG SCREEN, HOSP PERFORMED
AMPHETAMINES: NOT DETECTED
BENZODIAZEPINES: NOT DETECTED
Barbiturates: NOT DETECTED
Cocaine: NOT DETECTED
Opiates: NOT DETECTED
Tetrahydrocannabinol: NOT DETECTED

## 2013-09-15 LAB — ETHANOL: Alcohol, Ethyl (B): <11 mg/dL (ref 0–11)

## 2013-09-15 LAB — CBC
HCT: 44.5 % (ref 36.0–46.0)
Hemoglobin: 15.3 g/dL — ABNORMAL HIGH (ref 12.0–15.0)
MCH: 32.1 pg (ref 26.0–34.0)
MCHC: 34.4 g/dL (ref 30.0–36.0)
MCV: 93.5 fL (ref 78.0–100.0)
PLATELETS: 237 10*3/uL (ref 150–400)
RBC: 4.76 MIL/uL (ref 3.87–5.11)
RDW: 12.4 % (ref 11.5–15.5)
WBC: 6.5 10*3/uL (ref 4.0–10.5)

## 2013-09-15 LAB — GC/CHLAMYDIA PROBE AMP
CT PROBE, AMP APTIMA: NEGATIVE
GC Probe RNA: NEGATIVE

## 2013-09-15 LAB — ACETAMINOPHEN LEVEL: Acetaminophen (Tylenol), Serum: <15 ug/mL (ref 10–30)

## 2013-09-15 MED ORDER — PHENYTOIN SODIUM EXTENDED 100 MG PO CAPS
100.0000 mg | ORAL_CAPSULE | Freq: Three times a day (TID) | ORAL | Status: DC
  Administered 2013-09-15 – 2013-09-20 (×15): 100 mg via ORAL
  Filled 2013-09-15 (×18): qty 1

## 2013-09-15 MED ORDER — ACETAMINOPHEN 325 MG PO TABS
650.0000 mg | ORAL_TABLET | Freq: Four times a day (QID) | ORAL | Status: DC | PRN
  Administered 2013-09-20: 650 mg via ORAL
  Filled 2013-09-15: qty 2

## 2013-09-15 MED ORDER — MAGNESIUM HYDROXIDE 400 MG/5ML PO SUSP: 30.0000 mL | Freq: Every day | ORAL | Status: DC | PRN

## 2013-09-15 MED ORDER — PANTOPRAZOLE SODIUM 40 MG PO TBEC
80.0000 mg | DELAYED_RELEASE_TABLET | Freq: Every day | ORAL | Status: DC
Start: 2013-09-16 — End: 2013-09-20
  Administered 2013-09-16 – 2013-09-20 (×5): 80 mg via ORAL
  Filled 2013-09-15 (×5): qty 2

## 2013-09-15 MED ORDER — LISINOPRIL 40 MG PO TABS
40.0000 mg | ORAL_TABLET | Freq: Every day | ORAL | Status: DC
  Administered 2013-09-15 – 2013-09-20 (×5): 40 mg via ORAL
  Filled 2013-09-15: qty 2
  Filled 2013-09-15 (×7): qty 1

## 2013-09-15 MED ORDER — ALUM & MAG HYDROXIDE-SIMETH 200-200-20 MG/5ML PO SUSP: 30.0000 mL | ORAL | Status: DC | PRN

## 2013-09-15 MED ORDER — SIMVASTATIN 40 MG PO TABS
40.0000 mg | ORAL_TABLET | Freq: Every day | ORAL | Status: DC
  Administered 2013-09-15 – 2013-09-19 (×6): 40 mg via ORAL
  Filled 2013-09-15 (×4): qty 1
  Filled 2013-09-15: qty 2
  Filled 2013-09-15 (×2): qty 1

## 2013-09-15 MED ORDER — OXYCODONE-ACETAMINOPHEN 5-325 MG PO TABS
1.0000 | ORAL_TABLET | ORAL | Status: DC | PRN
  Administered 2013-09-15 – 2013-09-20 (×19): 1 via ORAL
  Filled 2013-09-15 (×19): qty 1

## 2013-09-15 MED ORDER — TRAZODONE HCL 50 MG PO TABS
50.0000 mg | ORAL_TABLET | Freq: Every day | ORAL | Status: DC
Start: 2013-09-15 — End: 2013-09-16
  Administered 2013-09-15: 50 mg via ORAL
  Filled 2013-09-15 (×3): qty 1

## 2013-09-15 MED ORDER — ALPRAZOLAM 0.5 MG PO TABS: 1.0000 mg | ORAL_TABLET | Freq: Every day | ORAL | Status: DC | PRN

## 2013-09-15 NOTE — ED Notes (Signed)
Pt given [place] sandwich, crackers, and sprite per MD request.

## 2013-09-15 NOTE — BH Assessment (Signed)
Tele Assessment Note   Claire Ponce is an 38 y.o. female that presents to [facility] with complains of suicidal thoughts. The onset of her suicidal thoughts started yesterday after learning that her grandmother passed away. She has a plan to cut her wrist with a knife. Pt is unable to contract for safety. She has a hx of cutting. She reports no cutting episodes since childhood. She has depressive symptoms including: hopelessness, isolating self from others, crying spells, and guilt. Pt's stressors are no only related to the death of her grandmother. She is currently homeless moved to [place]  from [place] last Thursday to be with her fiance. Says that she no only has a place to live but also has no support from outpatient psychiatric providers. She had a psychiatrist in [place] and expresses to this Probation officer that she would like to establish a provider here in [place]. Patient hospitalized 8x's in [place] mostly for medication management. She denies HI and AVH's. Patient denies current  alcohol and drug use. She does however report a history of alcoholism. She has remained sober for 10 yrs.   Patient accepted to [facility] by Claire Boga, NP to Dr. Louretta Ponce. The room assignment is 502-1.   Axis I: Major Depression, Recurrent severe Axis II: Deferred Axis III:  Past Medical History  Diagnosis Date  . Brain tumor   . Seizures   . Back pain   . Hypertension   . CHF (congestive heart failure)    Axis IV: economic problems, housing problems, other psychosocial or environmental problems, problems related to social environment, problems with access to health care services and problems with primary support group Axis V: 31-40 impairment in reality testing  Past Medical History:  Past Medical History  Diagnosis Date  . Brain tumor   . Seizures   . Back pain   . Hypertension   . CHF (congestive heart failure)     Past Surgical History  Procedure Laterality Date  . Brain surgery      Family  History:  Family History  Problem Relation Age of Onset  . Hypertension Mother   . Diabetes Mother   . Cancer Father   . Heart disease Father   . Hyperlipidemia Father   . Hypertension Father   . Hyperlipidemia Brother   . Hypertension Brother     Social History:  reports that she has been smoking Cigarettes.  She has been smoking about 0.00 packs per day. She does not have any smokeless tobacco history on file. She reports that she does not drink alcohol or use illicit drugs.  Additional Social History:  Alcohol / Drug Use Pain Medications: SEE MAR Prescriptions: SEE MAR Over the Counter: SEE MAR History of alcohol / drug use?: No history of alcohol / drug abuse ("I have not abused alcohol in 10 yrs. I have never used drugs")  CIWA: CIWA-Ar BP: 104/59 mmHg Pulse Rate: 70 COWS:    Allergies:  Allergies  Allergen Reactions  . Bee Venom Anaphylaxis  . Penicillins Anaphylaxis  . Toradol [Ketorolac Tromethamine] Anaphylaxis  . Latex Hives  . Morphine And Related Other (See Comments)    Causes Cramping.  Marland Kitchen Peach [Prunus Persica] Swelling  . Topamax [Topiramate] Hives and Nausea And Vomiting  . Tramadol Hives  . Vanilla Itching, Nausea And Vomiting and Swelling    If ingested it causes nausea and vomiting. If in topical products causes localized itching and swelling.    Home Medications:  (Not in a hospital admission)  OB/GYN  Status:  Patient's last menstrual period was 09/03/2013.  General Assessment Data Location of Assessment: [facility] ED Is this a Tele or Face-to-Face Assessment?: Tele Assessment Is this an Initial Assessment or a Re-assessment for this encounter?: Initial Assessment Living Arrangements: Other (Comment) (pt is homeless ) Can pt return to current living arrangement?: Yes Admission Status: Voluntary Is patient capable of signing voluntary admission?: Yes Transfer from: [facility] Referral Source: Self/Family/Friend     [place] Living Arrangements: Other (Comment) (pt is homeless ) Name of Psychiatrist:  (no current provider; "I had a psychiatrist 1 yr ago in [place]) Name of Therapist:  (no current therapist )  Education Status Is patient currently in school?: No  Risk to self Suicidal Ideation: Yes-Currently Present Suicidal Intent: Yes-Currently Present Is patient at risk for suicide?: Yes Suicidal Plan?: Yes-Currently Present Specify Current Suicidal Plan:  (cut wrist ) Access to Means: Yes Specify Access to Suicidal Means:  (sharp objects ) What has been your use of drugs/alcohol within the last 12 months?:  (No cuurent alcohol or drug use; has a hx of alcohol abuse ) Previous Attempts/Gestures: Yes How many times?:  (3 prior attempts ) Other Self Harm Risks:  (hx of cutting as a child ) Triggers for Past Attempts: Other (Comment) (depression, "found out I had a brain tumor, ) Intentional Self Injurious Behavior: None;Cutting (no current behaviors but has a history ) Family Suicide History: Unknown Recent stressful life event(s): Other (Comment);Loss (Comment) (found out grandmother passed yesterday ) Persecutory voices/beliefs?: No Depression: Yes Depression Symptoms: Feeling angry/irritable;Feeling worthless/self pity;Loss of interest in usual pleasures;Guilt;Fatigue;Isolating;Tearfulness;Insomnia;Despondent Substance abuse history and/or treatment for substance abuse?: No  Risk to Others Homicidal Ideation: No Thoughts of Harm to Others: No Current Homicidal Intent: No Current Homicidal Plan: No Access to Homicidal Means: No Identified Victim:  (n/a) History of harm to others?: No Assessment of Violence: None Noted Violent Behavior Description:  (patient is calm and cooperative ) Does patient have access to weapons?: No Criminal Charges Pending?: No Does patient have a court date: No  Psychosis Hallucinations: None noted (Denies but has a hx of psychosis ) Delusions: None noted  (Denies but has a previous hx of psychosis)  Mental Status Report Appear/Hygiene: Disheveled Eye Contact: Good Motor Activity: Freedom of movement Speech: Logical/coherent Level of Consciousness: Alert Mood: Depressed Affect: Appropriate to circumstance Anxiety Level: None Thought Processes: Coherent;Relevant Judgement: Impaired Orientation: Person;Place;Time;Situation Obsessive Compulsive Thoughts/Behaviors: None  Cognitive Functioning Concentration: Decreased Memory: Recent Intact;Remote Intact IQ: Average Insight: Fair Impulse Control: Good Appetite: Fair Weight Loss:  (none reported ) Weight Gain:  (none reported ) Sleep: No Change Total Hours of Sleep:  (varies ) Vegetative Symptoms: None  ADLScreening [facility] Assessment Services) Patient's cognitive ability adequate to safely complete daily activities?: Yes Patient able to express need for assistance with ADLs?: No Independently performs ADLs?: Yes (appropriate for developmental age)  Prior Inpatient Therapy Prior Inpatient Therapy: Yes Prior Therapy Dates:  (1995 to 2008-approx. 8 hospitalizations ) Prior Therapy Facilty/Provider(s):  (facilities in [place]) Reason for Treatment:  (medication managment, bad reaction to meds, depression)  Prior Outpatient Therapy Prior Outpatient Therapy: No Prior Therapy Dates:  (n/a) Prior Therapy Facilty/Provider(s):  (n/a) Reason for Treatment:  (n/a)  ADL Screening (condition at time of admission) Patient's cognitive ability adequate to safely complete daily activities?: Yes Is the patient deaf or have difficulty hearing?: No Does the patient have difficulty seeing, even when wearing glasses/contacts?: Yes Does the patient have difficulty concentrating, remembering,  or making decisions?: Yes Patient able to express need for assistance with ADLs?: No Does the patient have difficulty dressing or bathing?: No Independently performs ADLs?: Yes (appropriate for developmental  age) Does the patient have difficulty walking or climbing stairs?: No Weakness of Legs: None Weakness of Arms/Hands: None  Home Assistive Devices/Equipment Home Assistive Devices/Equipment: None    Abuse/Neglect Assessment (Assessment to be complete while patient is alone) Physical Abuse: Denies Verbal Abuse: Denies Sexual Abuse: Denies Exploitation of patient/patient's resources: Denies Self-Neglect: Denies Values / Beliefs Cultural Requests During Hospitalization: None Spiritual Requests During Hospitalization: None   Advance Directives (For Healthcare) Advance Directive: Patient does not have advance directive Nutrition Screen- [place] Adult/WL/AP Patient's home diet: Regular  Additional Information 1:1 In Past 12 Months?: No CIRT Risk: No Elopement Risk: No Does patient have medical clearance?: Yes     Disposition:  Disposition Initial Assessment Completed for this Encounter: Yes Disposition of Patient: Inpatient treatment program Type of inpatient treatment program: Adult (Accepted by Claire Boga, NP to Dr. Louretta Ponce Room 502-1)  Waldon Merl [facility] 09/15/2013 9:57 AM

## 2013-09-15 NOTE — ED Notes (Signed)
Pt resting in bed with eyes shut.  Pt aware we need urine.

## 2013-09-15 NOTE — ED Notes (Signed)
Pt states that her grandmother passed today. Pt states that she was seen earlier for PID and was discharged. Pt states that she was having thoughts of SI then but did not say anything while here. Pt states that she wanted to cut her wrist when she got home and decided to come back (with her stuff) to be seen the patient states she has not been taking her regular medications.
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
## 2013-09-15 NOTE — MAU Provider Note (Signed)
Attestation of Attending Supervision of Advanced Practitioner (PA/CNM/NP): Evaluation and management procedures were performed by the Advanced Practitioner under my supervision and collaboration.  I have reviewed the Advanced Practitioner's note and chart, and I agree with the management and plan.  Maciej Schweitzer, MD, FACOG Attending Obstetrician & Gynecologist Faculty Practice, [facility]  

## 2013-09-15 NOTE — Progress Notes (Signed)
[facility] Group Notes:  (Nursing/MHT/Case Management/Adjunct)  Date:  09/15/2013  Time:  11:49 PM  Type of Therapy:  Group Therapy  Participation Level:  Minimal  Participation Quality:  Appropriate  Affect:  Anxious and Blunted  Cognitive:  Lacking  Insight:  Improving  Engagement in Group:  Engaged  Modes of Intervention:  Socialization and Support  Summary of Progress/Problems: Pt. Participated in group discussion on relapse prevention, but had trouble with focusing on topic.  Pt. Stated she wanted to figure out why she was admitted to the facility.  Staff encouraged pt. To talk with RN and MD and to develop plan to learn coping skills.  Lanell Persons 09/15/2013, 11:49 PM
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
## 2013-09-15 NOTE — [facility] Group Notes (Signed)
[facility] LCSW Group Therapy  09/15/2013 1:15 PM   Type of Therapy:  Group Therapy  Participation Level:  Did Not Attend - pt is new and being admitted  Regan Lemming, [place] 09/15/2013 2:45 PM

## 2013-09-15 NOTE — ED Provider Notes (Signed)
9:38 AM Pt accepted to [facility], Dr. Lamar Benes for SI.  I was not directly involved in pt care or disposition planning.   1. Suicidal ideation      Neta Ehlers, MD 09/15/13 [phone]

## 2013-09-15 NOTE — ED Notes (Signed)
Pt asked if she could obtain a urine sample. Pt stated that she still couldn't go at the present time. Will follow up shortly.

## 2013-09-15 NOTE — Progress Notes (Signed)
B.Zamaria Brazzle, MHT received report that patient has been accepted to [facility] by Dr. Louretta Shorten and has been assigned to [address] . Writer in to complete support paper work for voluntary admission. Patient declined to provide individuals to release information but signed form. Support paper work has been faxed to [facility] attending nurse will facilitate transfer
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
## 2013-09-16 ENCOUNTER — Encounter (HOSPITAL_COMMUNITY): Payer: Self-pay | Admitting: Psychiatry

## 2013-09-16 DIAGNOSIS — R45851 Suicidal ideations: Secondary | ICD-10-CM

## 2013-09-16 DIAGNOSIS — R569 Unspecified convulsions: Secondary | ICD-10-CM | POA: Insufficient documentation

## 2013-09-16 DIAGNOSIS — F332 Major depressive disorder, recurrent severe without psychotic features: Principal | ICD-10-CM

## 2013-09-16 DIAGNOSIS — I10 Essential (primary) hypertension: Secondary | ICD-10-CM | POA: Insufficient documentation

## 2013-09-16 DIAGNOSIS — G930 Cerebral cysts: Secondary | ICD-10-CM | POA: Insufficient documentation

## 2013-09-16 DIAGNOSIS — I509 Heart failure, unspecified: Secondary | ICD-10-CM | POA: Insufficient documentation

## 2013-09-16 MED ORDER — TRAZODONE HCL 100 MG PO TABS
100.0000 mg | ORAL_TABLET | Freq: Every day | ORAL | Status: DC
  Administered 2013-09-16: 100 mg via ORAL
  Filled 2013-09-16 (×4): qty 1

## 2013-09-16 MED ORDER — FLUOXETINE HCL 20 MG PO CAPS
20.0000 mg | ORAL_CAPSULE | Freq: Every day | ORAL | Status: DC
  Administered 2013-09-17 – 2013-09-20 (×3): 20 mg via ORAL
  Filled 2013-09-16 (×5): qty 1

## 2013-09-16 MED ORDER — HYDROXYZINE HCL 25 MG PO TABS
25.0000 mg | ORAL_TABLET | Freq: Four times a day (QID) | ORAL | Status: DC | PRN
  Administered 2013-09-16 – 2013-09-20 (×7): 25 mg via ORAL
  Filled 2013-09-16 (×8): qty 1

## 2013-09-16 NOTE — Progress Notes (Signed)
D.  Pt pleasant on approach, requested Percocet as ordered but states pain is still quite great (see pain flowsheet).  Pt was positive for evening wrap up group, interacting appropriately within milieu.  Pt requested conditioner for her hair and was given a cup with some in it.  Pt pleased.  Denies SI/HI/hallucinations at this time.  A.  Support and encouragement offered  R.  Pt remains safe on unit, will continue to monitor.

## 2013-09-16 NOTE — Progress Notes (Signed)
Patient ID: Claire Ponce, female   DOB: [birth year], 38 y.o.   MRN: [id number] D: Patient in room on approach. Pt mood and affect appeared depressed and flat. Pt c/o lower back pain. Pt rated depression and anxiety as 8 on a 0-10 scale. Pt denies SI/HI/AVH. Pt attended evening wrap up group and Interacted appropriately with peers. Pt denies any needs or concerns.  Cooperative with assessment. No acute distressed noted at this time.   A: Met with pt 1:1. Medications administered as prescribed. Writer encouraged pt to discuss feelings. Pt encouraged to come to staff with any question or concerns.   R: Patient remains safe. She is complaint with medications and denies any adverse reaction. Continue current POC.
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
## 2013-09-16 NOTE — Progress Notes (Signed)
D Theta has had a so-so day today. She remains dissatisfied with the service she has been provided, staying disgruntled and unhappy most of the day.   A She attends some of her groups and is minimally engaged in her recovery as evidenced by her focus on receiving percocet EXACTLY every 4 hrs today. Per MD order, she is started on prozac and vistaril Q 6 prn and xanax is DC'd ( which she is very unhappy about). Per her requests, she has been given percocet twice today, for stomach and head pain.with moderate relief noted.   R She contracts for safety but does not complete her slef invnetory as requested. POC cont.

## 2013-09-16 NOTE — Progress Notes (Signed)
Psychoeducational Group Note  Date: 09/16/2013 Time:  1015  Group Topic/Focus:  Identifying Needs:   The focus of this group is to help patients identify their personal needs that have been historically problematic and identify healthy behaviors to address their needs.  Participation Level:  Active  Participation Quality:  Appropriate  Affect:  Flat  Cognitive:  Appropriate  Insight:  improving  Engagement in Group:  Engaged  Additional Comments:    Aleksa Catterton A  

## 2013-09-16 NOTE — Progress Notes (Signed)
[facility] Group Notes:  (Nursing/MHT/Case Management/Adjunct)  Date:  09/16/2013  Time:  11:42 PM  Type of Therapy:  Group Therapy  Participation Level:  Minimal  Participation Quality:  Appropriate  Affect:  Appropriate  Cognitive:  Appropriate  Insight:  Improving  Engagement in Group:  Developing/Improving  Modes of Intervention:  Socialization and Support  Summary of Progress/Problems: Pt. Rated her energy level as a 10.  Pt. Stated she was admitted to the hospital for anxiety and depression.  Pt. Stated she did not have any coping skills.   Pt. Was encouraged to learn and develop coping skills for depression.  Lanell Persons 09/16/2013, 11:42 PM

## 2013-09-16 NOTE — Progress Notes (Signed)
Adult Psychoeducational Group Note  Date:  09/16/2013 Time:  6:58 PM  Group Topic/Focus:  Healthy Communication:   The focus of this group is to discuss communication, barriers to communication, as well as healthy ways to communicate with others.  Participation Level:  Active  Participation Quality:  Appropriate  Affect:  Appropriate  Cognitive:  Appropriate  Insight: Appropriate  Engagement in Group:  Engaged  Modes of Intervention:  Activity, Discussion and [place], [place] 09/16/2013, 6:58 PM

## 2013-09-16 NOTE — Progress Notes (Signed)
.  Psychoeducational Group Note    Date: 09/16/2013 Time:  0930  Goal Setting Purpose of Group: To be able to set a goal that is measurable and that can be accomplished in one day Participation Level:  Active  Participation Quality:  Appropriate  Affect:  Appropriate  Cognitive:  Oriented  Insight:  Improving  Engagement in Group:  Engaged  Additional Comments:    Paulino Rily

## 2013-09-16 NOTE — BHH Suicide Risk Assessment (Signed)
   Nursing information obtained from:  Patient Demographic factors:  Caucasian;Low socioeconomic status Current Mental Status:    Loss Factors:  Financial problems / change in socioeconomic status Historical Factors:  Prior suicide attempts Risk Reduction Factors:  Religious beliefs about death;Positive therapeutic relationship Total Time spent with patient: 20 minutes  CLINICAL FACTORS:   Severe Anxiety and/or Agitation Depression:   Anhedonia Hopelessness Impulsivity Insomnia Epilepsy Chronic Pain Unstable or Poor Therapeutic Relationship  Psychiatric Specialty Exam: Physical Exam  Psychiatric: Her speech is normal and behavior is normal. Judgment normal. Cognition and memory are normal. She exhibits a depressed mood. She expresses suicidal ideation.    Review of Systems  Constitutional: Negative.   HENT: Negative.   Eyes: Negative.   Respiratory: Negative.   Cardiovascular: Negative.   Gastrointestinal: Negative.   Genitourinary: Negative.   Musculoskeletal: Negative.   Skin: Negative.   Neurological: Positive for seizures.  Endo/Heme/Allergies: Negative.   Psychiatric/Behavioral: Positive for depression and suicidal ideas. The patient is nervous/anxious and has insomnia.     Blood pressure 112/80, pulse 87, temperature 97.5 F (36.4 C), temperature source Oral, height 5\' 4"  (1.626 m), weight 115.667 kg (255 lb), last menstrual period 09/03/2013, SpO2 96.00%.Body mass index is 43.75 kg/(m^2).  General Appearance: Fairly Groomed  Engineer, water::  Good  Speech:  Clear and Coherent  Volume:  Normal  Mood:  Depressed and Hopeless  Affect:  Flat  Thought Process:  Goal Directed  Orientation:  Full (Time, Place, and Person)  Thought Content:  Negative  Suicidal Thoughts:  Yes.  without intent/plan  Homicidal Thoughts:  No  Memory:  Immediate;   Fair Recent;   Fair Remote;   Fair  Judgement:  Poor  Insight:  Lacking  Psychomotor Activity:  Decreased  Concentration:   Fair  Recall:  [employer] of Knowledge:Fair  Language: Good  Akathisia:  No  Handed:  Right  AIMS (if indicated):     Assets:  Communication Skills Desire for Improvement Physical Health Social Support  Sleep:  Number of Hours: 6   Musculoskeletal: Strength & Muscle Tone: within normal limits Gait & Station: normal Patient leans: N/A  COGNITIVE FEATURES THAT CONTRIBUTE TO RISK:  Closed-mindedness    SUICIDE RISK:   Mild:  Suicidal ideation of limited frequency, intensity, duration, and specificity.  There are no identifiable plans, no associated intent, mild dysphoria and related symptoms, good self-control (both objective and subjective assessment), few other risk factors, and identifiable protective factors, including available and accessible social support.  PLAN OF CARE:1. Admit for crisis management and stabilization. 2. Medication management to reduce current symptoms to base line and improve the     patient's overall level of functioning 3. Treat health problems as indicated. 4. Develop treatment plan to decrease risk of relapse upon discharge and the need for     readmission. 5. Psycho-social education regarding relapse prevention and self care. 6. Health care follow up as needed for medical problems. 7. Restart home medications where appropriate.  I certify that inpatient services furnished can reasonably be expected to improve the patient's condition.  Corena Pilgrim, MD 09/16/2013, 3:51 PM

## 2013-09-16 NOTE — BHH Group Notes (Signed)
[facility] Group Notes:  (Clinical Social Work)  09/16/2013   1:15-2:15PM  Summary of Progress/Problems:   The main focus of today's process group was for the patient to identify ways in which they have sabotaged their own mental health wellness/recovery.  Motivational interviewing and a handout were used to explore the benefits and costs of their self-sabotaging behavior as well as the benefits and costs of changing this behavior.  The Stages of Change were explained to the group using a handout, and patients identified where they are with regard to changing self-defeating behaviors.  The patient expressed she self-sabotages with cutting herself, which makes her forget her problems for awhile.  She was once hospitalized and continuing to cut herself, so they put casts on her arms to prevent this so that her arms could heal.  She states the benefits of changing the behavior would be to prevent future scarring and to have less physical pain to deal with.  Type of Therapy:  Process Group  Participation Level:  Active  Participation Quality:  Attentive, Sharing and Supportive  Affect:  Appropriate and Blunted  Cognitive:  Appropriate and Oriented  Insight:  Engaged  Engagement in Therapy:  Engaged  Modes of Intervention:  Education, Motivational Interviewing   Selmer Dominion, LCSW 09/16/2013, 4:00pm
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
## 2013-09-17 LAB — GLUCOSE, CAPILLARY: Glucose-Capillary: 104 mg/dL — ABNORMAL HIGH (ref 70–99)

## 2013-09-17 MED ORDER — OXYCODONE-ACETAMINOPHEN 5-325 MG PO TABS
ORAL_TABLET | ORAL | Status: AC
  Filled 2013-09-17: qty 1

## 2013-09-17 MED ORDER — PRAZOSIN HCL 1 MG PO CAPS
1.0000 mg | ORAL_CAPSULE | Freq: Every day | ORAL | Status: DC
  Administered 2013-09-17 – 2013-09-19 (×3): 1 mg via ORAL
  Filled 2013-09-17 (×4): qty 1

## 2013-09-17 MED ORDER — TRAZODONE HCL 100 MG PO TABS
100.0000 mg | ORAL_TABLET | Freq: Every evening | ORAL | Status: DC | PRN
  Administered 2013-09-17 – 2013-09-19 (×3): 100 mg via ORAL
  Filled 2013-09-17: qty 1
  Filled 2013-09-17 (×2): qty 14
  Filled 2013-09-17: qty 1

## 2013-09-17 NOTE — [facility] Group Notes (Signed)
Psychoeducational Group Note  Date:  09/17/2013 Time:  0930  Group Topic/Focus:  Making Healthy Choices:   The focus of this group is to help patients identify negative/unhealthy choices they were using prior to admission and identify positive/healthier coping strategies to replace them upon discharge.  Participation Level: appropriate   Participation Quality: appropraite    Affect:  Appropriate  Cognitive: oriented  Insight: improving  Engagement in Group: engaged Additional Comments:    Karel Jarvis 09/17/2013, 11:10 AM

## 2013-09-17 NOTE — [facility] Group Notes (Signed)
[place] Group Notes:  (Clinical Social Work)  09/17/2013   3-4PM  Summary of Progress/Problems:  The main focus of today's process group was to identify the patient's current support system and decide on other supports that can be put in place.  The illustration of a 4-legged chair was used to discuss why additional supports are needed.  An emphasis was placed on using counselor, doctor, therapy groups, 12-step groups, and problem-specific support groups to expand supports.   There was also an extensive discussion about what constitutes a healthy support versus an unhealthy support.  The patient expressed comprehension of the concepts presented.  Type of Therapy:  Process Group  Participation Level:  Active  Participation Quality:  Attentive and Sharing  Affect:  Blunted, Drowsy  Cognitive:  Appropriate and Oriented  Insight:  Engaged  Engagement in Therapy:  Engaged  Modes of Intervention:  Education,  Support and [employer], LCSW 09/17/2013, 4:00pm

## 2013-09-17 NOTE — Progress Notes (Signed)
D Riham remains convinced she cannot walk without the aide of a walker. She says her pain never gets below a "6" . She has slept in between her groups today and presents to the med window every 4 hrs, requesting " my  Pain medicine".    A she is excited about taking minipress to night and is hopful it will help her with her sleep.   R She is encouraged by staff to complete and hand in her self inventory and has yet to do so.

## 2013-09-17 NOTE — Progress Notes (Signed)
D.  Pt pleasant but anxious on approach.  Not using walker tonight and states that she is happy to not be using it anymore as it was cumbersome.  Pt denies SI/HI/hallucinations at this time.  Interacting appropriately within milieu.  Pt positive for evening wrap up group.  Did request staff to remain in bathroom with her during shower because she was feeling somewhat unsteady.  This staff member remained with Pt and she did well.    A.  Support and encouragement offered, medication given as ordered for chronic pain.  R.  Pt remains safe on unit, will continue to monitor.

## 2013-09-17 NOTE — Progress Notes (Signed)
Adult Psychoeducational Group Note  Date:  09/17/2013 Time:  9:27 PM  Group Topic/Focus:  Wrap-Up Group:   The focus of this group is to help patients review their daily goal of treatment and discuss progress on daily workbooks.  Participation Level:  Active  Participation Quality:  Appropriate  Affect:  Appropriate  Cognitive:  Appropriate  Insight: Appropriate  Engagement in Group:  Engaged  Modes of Intervention:  Discussion  Additional Comments:The patient expressed that she learned in group to realized that good address her issues.  Nash Shearer 09/17/2013, 9:27 PM
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
## 2013-09-17 NOTE — Progress Notes (Signed)
Psychoeducational Group Note  Date:  09/17/2013 Time:  1015  Group Topic/Focus:  Making Healthy Choices:   The focus of this group is to help patients identify negative/unhealthy choices they were using prior to admission and identify positive/healthier coping strategies to replace them upon discharge.  Participation Level:  Active  Participation Quality:  Appropriate  Affect:  Flat  Cognitive:  Oriented  Insight:  Improving  Engagement in Group:  Engaged  Additional Comments:    Glynn Freas A 09/17/2013 

## 2013-09-18 DIAGNOSIS — F4310 Post-traumatic stress disorder, unspecified: Secondary | ICD-10-CM

## 2013-09-18 MED ORDER — ALBUTEROL SULFATE HFA 108 (90 BASE) MCG/ACT IN AERS
1.0000 | INHALATION_SPRAY | RESPIRATORY_TRACT | Status: DC | PRN
  Administered 2013-09-18 (×2): 2 via RESPIRATORY_TRACT
  Filled 2013-09-18: qty 6.7

## 2013-09-18 NOTE — [facility] Suicide Risk Assessment (Signed)
[facility] Adult Inpatient Family/Significant Other Suicide Prevention Education  Suicide Prevention Education:   Patient Refusal for Family/Significant Other Suicide Prevention Education: The patient has refused to provide written consent for family/significant other to be provided Family/Significant Other Suicide Prevention Education during admission and/or prior to discharge.  Physician notified.  CSW provided suicide prevention information with patient.    The suicide prevention education provided includes the following:  Suicide risk factors  Suicide prevention and interventions  National Suicide Hotline telephone number  [facility] assessment telephone number  [facility] Emergency Assistance [place] and/or Residential Mobile Crisis Unit telephone number   Regan Lemming, [place] 09/18/2013 3:24 PM

## 2013-09-18 NOTE — Progress Notes (Signed)
Patient ID: Claire Ponce, female   DOB: Sep 01, 1975, 38 y.o.   MRN: [id number]  D: Pt. Denies SI/HI and A/V Hallucinations. Patient did not turn in daily inventory sheet although writer requested for pt to fill it out.  Patient reports pain 10/10 for lower back and anxiety. Patient received PRN medication for these complaints.   A: Support and encouragement provided to the patient. Patient refused morning medications, but did receive her noon medications. Writer will continue to administer pt's scheduled meds prescribed by physician as patient allows.  R: Patient is irritable and minimal with Probation officer. Patient is seen in the milieu at times and is attending some groups. Q15 minute checks are maintained for safety.

## 2013-09-18 NOTE — [facility] Group Notes (Signed)
[place] LCSW Group Therapy  09/18/2013   1:15 PM   Type of Therapy:  Group Therapy  Participation Level:  Active  Participation Quality:  Attentive, Sharing and Supportive  Affect:  Calm  Cognitive:  Alert and Oriented  Insight:  Developing/Improving and Engaged  Engagement in Therapy:  Developing/Improving and Engaged  Modes of Intervention:  Clarification, Confrontation, Discussion, Education, Exploration, Limit-setting, Orientation, Problem-solving, Rapport Building, Art therapist, Socialization and Support  Summary of Progress/Problems: Pt identified obstacles faced currently and processed barriers involved in overcoming these obstacles. Pt identified steps necessary for overcoming these obstacles and explored motivation (internal and external) for facing these difficulties head on. Pt further identified one area of concern in their lives and chose a goal to focus on for today.  Pt shared her biggest obstacle is her past and learning how to overcome it.  Pt discussed how her past still affects her today and after being sober for 10 years she almost relapsed due to her past.  Pt states that the only reason she didn't relapse is because she became sick and was at the ED.  Pt was supportive to peers and discussed how she maintained sobriety.  Pt also states that she doesn't feel she needs to be here, but since she is will get what she can from it, which was improvement from pt's irritable attitude about being here this morning.  Pt actively participated and was engaged in group discussion.    Regan Lemming, LCSW 09/18/2013  3:22 PM
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
## 2013-09-18 NOTE — Tx Team (Signed)
Interdisciplinary Treatment Plan Update (Adult)  Date: 09/18/2013  Time Reviewed:  9:45 AM  Progress in Treatment: Attending groups: Yes Participating in groups:  Yes Taking medication as prescribed:  Yes Tolerating medication:  Yes Family/Significant othe contact made: CSW assessing  Patient understands diagnosis:  Yes Discussing patient identified problems/goals with staff:  Yes Medical problems stabilized or resolved:  Yes Denies suicidal/homicidal ideation: Yes Issues/concerns per patient self-inventory:  Yes Other:  New problem(s) identified: N/A  Discharge Plan or Barriers: CSW assessing for appropriate referrals.  Reason for Continuation of Hospitalization: Anxiety Depression Medication Stabilization  Comments: N/A  Estimated length of stay: 3-5 days  For review of initial/current patient goals, please see plan of care.  Attendees: Patient:     Family:     Physician:  Dr. Johnalagadda 09/18/2013 10:33 AM   Nursing:   Carol Davis, RN 09/18/2013 10:33 AM   Clinical Social Worker:  Jacub Waiters Horton, LCSW 09/18/2013 10:33 AM   Other: Conrad Withrow, PA 09/18/2013 10:33 AM   Other:  Valerie Noch, care coordination 09/18/2013 10:33 AM   Other:  Quylle Hodnett, LCSW 09/18/2013 10:33 AM   Other:  Beverly Knight, RN 09/18/2013 10:34 AM   Other: Jennifer Clark, case manager 09/18/2013 10:34 AM   Other:    Other:    Other:    Other:    Other:     Scribe for Treatment Team:   Horton, Cathyann Kilfoyle Nicole, 09/18/2013 10:33 AM    

## 2013-09-18 NOTE — Progress Notes (Signed)
Patient ID: Claire Ponce, female   DOB: [birth year], 38 y.o.   MRN: [id number] D: pt. Visible on unit in dayroom interacting and watching TV. Pt. Denies SHI. A: Writer introduced self to client and encourage group. Staff will monitor q41min for safety R: Pt. Is safe on the unit and attended group.

## 2013-09-18 NOTE — [facility] Group Notes (Signed)
[facility] LCSW Aftercare Discharge Planning Group Note   09/18/2013 8:45 AM  Participation Quality:  Alert, Appropriate and Oriented  Mood/Affect:  Irritable  Depression Rating:  1  Anxiety Rating:  "20" - states due to getting married next week  Thoughts of Suicide:  Pt denies SI/HI  Will you contract for safety?   Yes  Current AVH:  Pt denies  Plan for Discharge/Comments:  Pt attended discharge planning group and actively participated in group.  CSW provided pt with today's workbook.  Pt reports that she is here at the hospital by mistake.  Pt states that she was in the ED and staff overheard her talking to a friend about pain and it was mistaken as SI.  Pt states that she resides in [place] and does not have follow up.  CSW will assess for appropriate referrals.  No further needs voiced by pt at this time.    Transportation Means: Pt reports access to transportation - provided pt with a bus pass  Supports: No supports mentioned at this time  Regan Lemming, [place] 09/18/2013 10:03 AM

## 2013-09-18 NOTE — Progress Notes (Signed)
Pt attended group. Positive thing happened = got her inhaler

## 2013-09-19 NOTE — Progress Notes (Signed)
The focus of this group is to educate the patient on the purpose and policies of crisis stabilization and provide a format to answer questions about their admission.  The group details unit policies and expectations of patients while admitted. Patient was sleep during most of the group.

## 2013-09-19 NOTE — Progress Notes (Signed)
D: Patient denies SI/HI and A/V hallucinations; patient having complaints about pain and has been given medication as needed; patient also having complaints because she felt that she is ready to go home and patient became tearful when she found this out  A: Monitored q 15 minutes; patient encouraged to attend groups; patient educated about medications; patient given medications per physician orders; patient encouraged to express feelings and/or concerns  R: Patient is attention seeking at times; patient is assertive and had frequent request;  patient's interaction with staff and peers is appropriate and seen laughing and joking with peers on the unit; patient was able to set goal to talk with staff 1:1 when having feelings of SI; patient is taking medications as prescribed and tolerating medications; patient is attending all groups but engaging minimally

## 2013-09-19 NOTE — [facility] Group Notes (Signed)
[facility] LCSW Group Therapy  09/19/2013   1:15 PM   Type of Therapy:  Group Therapy  Participation Level:  Active  Participation Quality:  Attentive, Sharing and Supportive  Affect:  Depressed and Flat  Cognitive:  Alert and Oriented  Insight:  Developing/Improving and Engaged  Engagement in Therapy:  Developing/Improving and Engaged  Modes of Intervention:  Activity, Clarification, Confrontation, Discussion, Education, Exploration, Limit-setting, Orientation, Problem-solving, Rapport Building, Reality Testing, Socialization and Support  Summary of Progress/Problems: Patient was attentive and engaged with speaker from [employer].  Patient was attentive to speaker while they shared their story of dealing with mental health and overcoming it.  Patient expressed interest in their programs and services and received information on their agency.  Patient processed ways they can relate to the speaker.     Armenta Erskin Horton, LCSW 09/19/2013  1:29 PM     

## 2013-09-19 NOTE — Progress Notes (Signed)
D   Pt is pleasant on approach   She is care taking of other pts   She is loud with pressured speech   She denies suicidal ideation   She interacts well with others A   Verbal support given   Medications administered and effectiveness monitored   Q 15 min checks R  Pt safe at present
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
## 2013-09-19 NOTE — Progress Notes (Signed)
Recreation Therapy Notes  Animal-Assisted Activity/Therapy (AAA/T) Program Checklist/Progress Notes Patient Eligibility Criteria Checklist & Daily Group note for Rec Tx Intervention  Date: 03.17.2015 Time: 2:45am Location: [address]   AAA/T Program Assumption of Risk Form signed by Patient/ or Parent Legal Guardian yes  Patient is free of allergies or sever asthma yes  Patient reports no fear of animals yes  Patient reports no history of cruelty to animals yes   Patient understands his/her participation is voluntary yes  Patient washes hands before animal contact yes  Patient washes hands after animal contact yes  Behavioral Response: Engaged, Appropriate   Education: Contractor, Appropriate Animal Interaction   Education Outcome: Acknowledges understanding   Clinical Observations/Feedback: Patient interacted appropriately with therapy dog team. Patient shared stories about pets she has had in the past and asked appropriate questions about therapy dog and his training.   Laureen Ochs Nadalie Laughner, LRT/CTRS  Lane Hacker 09/19/2013 3:58 PM

## 2013-09-20 DIAGNOSIS — F4312 Post-traumatic stress disorder, chronic: Secondary | ICD-10-CM | POA: Diagnosis present

## 2013-09-20 DIAGNOSIS — F332 Major depressive disorder, recurrent severe without psychotic features: Principal | ICD-10-CM | POA: Diagnosis present

## 2013-09-20 MED ORDER — FLUOXETINE HCL 20 MG PO CAPS
20.0000 mg | ORAL_CAPSULE | Freq: Every day | ORAL | Status: AC
Start: 2013-09-20 — End: ?

## 2013-09-20 MED ORDER — PHENYTOIN SODIUM EXTENDED 100 MG PO CAPS: 100.0000 mg | ORAL_CAPSULE | Freq: Three times a day (TID) | ORAL | Status: AC

## 2013-09-20 MED ORDER — ESOMEPRAZOLE MAGNESIUM 40 MG PO CPDR: 40.0000 mg | DELAYED_RELEASE_CAPSULE | Freq: Every day | ORAL | Status: AC

## 2013-09-20 MED ORDER — EPINEPHRINE 0.3 MG/0.3ML IJ SOAJ: 0.3000 mg | Freq: Once | INTRAMUSCULAR | Status: AC

## 2013-09-20 MED ORDER — ALBUTEROL SULFATE HFA 108 (90 BASE) MCG/ACT IN AERS: 1.0000 | INHALATION_SPRAY | RESPIRATORY_TRACT | Status: AC | PRN

## 2013-09-20 MED ORDER — HYDROXYZINE HCL 25 MG PO TABS: 25.0000 mg | ORAL_TABLET | Freq: Four times a day (QID) | ORAL | Status: AC | PRN

## 2013-09-20 MED ORDER — OXYCODONE-ACETAMINOPHEN 5-325 MG PO TABS: 1.0000 | ORAL_TABLET | ORAL | Status: DC | PRN

## 2013-09-20 MED ORDER — LISINOPRIL 40 MG PO TABS: 40.0000 mg | ORAL_TABLET | Freq: Every day | ORAL | Status: AC

## 2013-09-20 MED ORDER — SIMVASTATIN 40 MG PO TABS
40.0000 mg | ORAL_TABLET | Freq: Every day | ORAL | Status: AC
Start: 2013-09-20 — End: ?

## 2013-09-20 MED ORDER — PRAZOSIN HCL 1 MG PO CAPS: 1.0000 mg | ORAL_CAPSULE | Freq: Every day | ORAL | Status: AC

## 2013-09-20 MED ORDER — TRAZODONE HCL 100 MG PO TABS: 100.0000 mg | ORAL_TABLET | Freq: Every evening | ORAL | Status: AC | PRN

## 2013-09-20 NOTE — Progress Notes (Signed)
Adult Psychoeducational Group Note  Date:  09/20/2013 Time:  10:00am Group Topic/Focus:  Personal Choices and Values:   The focus of this group is to help patients assess and explore the importance of values in their lives, how their values affect their decisions, how they express their values and what opposes their expression.  Participation Level:  Active  Participation Quality:  Appropriate and Attentive  Affect:  Appropriate  Cognitive:  Alert and Appropriate  Insight: Appropriate  Engagement in Group:  Engaged  Modes of Intervention:  Discussion and Education  Additional Comments:   Pt. attended and participated in group.  Discussion was on wellness and what it means to you. Pt stated personal development means learning more about self and doing things properly.  [name] D 09/20/2013, 2:15 PM

## 2013-09-20 NOTE — Tx Team (Signed)
Interdisciplinary Treatment Plan Update (Adult)  Date: 09/20/2013  Time Reviewed:  9:45 AM  Progress in Treatment: Attending groups: Yes Participating in groups:  Yes Taking medication as prescribed:  Yes Tolerating medication:  Yes Family/Significant othe contact made: No, pt refused Patient understands diagnosis:  Yes Discussing patient identified problems/goals with staff:  Yes Medical problems stabilized or resolved:  Yes Denies suicidal/homicidal ideation: Yes Issues/concerns per patient self-inventory:  Yes Other:  New problem(s) identified: N/A  Discharge Plan or Barriers: Pt will follow up at [facility] for outpatient medication management and therapy.    Reason for Continuation of Hospitalization: Stable to d/c today  Comments: N/A  Estimated length of stay: D/C today  For review of initial/current patient goals, please see plan of care.  Attendees: Patient:  Claire Ponce  09/20/2013 10:41 AM   Family:     Physician:  Dr. Zorita Pang 09/20/2013 10:41 AM   Nursing:   Gaylan Gerold, RN 09/20/2013 10:41 AM   Clinical Social Worker:  Regan Lemming, LCSW 09/20/2013 10:41 AM   Other: Catalina Pizza, PA 09/20/2013 10:41 AM   Other:  Gala Romney, care coordination 09/20/2013 10:41 AM   Other:  Joette Catching, LCSW 09/20/2013 10:41 AM   Other:  Marilynne Halsted, RN 09/20/2013 10:41 AM   Other:    Other:    Other:    Other:    Other:      Scribe for Treatment Team:   Ane Payment, 09/20/2013 , 10:41 AM

## 2013-09-20 NOTE — BHH Group Notes (Signed)
[facility] LCSW Aftercare Discharge Planning Group Note   09/20/2013 8:45 AM  Participation Quality:  Alert, Appropriate and Oriented  Mood/Affect:  Irritable  Depression Rating:  0  Anxiety Rating:  9 - states due to wanting to d/c  Thoughts of Suicide:  Pt denies SI/HI  Will you contract for safety?   Yes  Current AVH:  Pt denies  Plan for Discharge/Comments:  Pt attended discharge planning group and actively participated in group.  CSW provided pt with today's workbook.  Pt reports feeling ready to d/c soon.  Pt is unsure where she will live upon d/c but states she will figure it out.  Pt will follow up at [facility] for outpatient medication management and therapy.  No further needs voiced by pt at this time.    Transportation Means: Pt reports access to transportation - provided pt with a bus pass  Supports: No supports mentioned at this time  Regan Lemming, LCSW 09/20/2013 9:47 AM

## 2013-09-20 NOTE — BHH Suicide Risk Assessment (Signed)
   Demographic Factors:  Adolescent or young adult, Caucasian, Low socioeconomic status and Unemployed  Total Time spent with patient: 30 minutes  Psychiatric Specialty Exam: Physical Exam  ROS  Blood pressure 100/62, pulse 87, temperature 97.3 F (36.3 C), temperature source Oral, resp. rate 18, height 5\' 4"  (1.626 m), weight 115.667 kg (255 lb), last menstrual period 09/03/2013, SpO2 96.00%.Body mass index is 43.75 kg/(m^2).  General Appearance: Casual  Eye Contact::  Good  Speech:  Clear and Coherent  Volume:  Normal  Mood:  Anxious  Affect:  Appropriate and Congruent  Thought Process:  Goal Directed and Intact  Orientation:  Full (Time, Place, and Person)  Thought Content:  WDL  Suicidal Thoughts:  No  Homicidal Thoughts:  No  Memory:  Immediate;   Good  Judgement:  Fair  Insight:  Fair  Psychomotor Activity:  Normal  Concentration:  Good  Recall:  Good  Fund of Knowledge:Good  Language: Good  Akathisia:  NA  Handed:  Right  AIMS (if indicated):     Assets:  Communication Skills Desire for Improvement Leisure Time Physical Health Resilience Social Support  Sleep:  Number of Hours: 6.75    Musculoskeletal: Strength & Muscle Tone: within normal limits Gait & Station: normal Patient leans: N/A   Mental Status Per Nursing Assessment::   On Admission:     Current Mental Status by Physician: NA  Loss Factors: Decrease in vocational status and Financial problems/change in socioeconomic status  Historical Factors: Family history of mental illness or substance abuse and Impulsivity  Risk Reduction Factors:   Sense of responsibility to family, Religious beliefs about death, Living with another person, especially a relative, Positive social support, Positive therapeutic relationship and Positive coping skills or problem solving skills  Continued Clinical Symptoms:  Depression:   Recent sense of peace/wellbeing Previous Psychiatric Diagnoses and  Treatments Medical Diagnoses and Treatments/Surgeries  Cognitive Features That Contribute To Risk:  Polarized thinking    Suicide Risk:  Minimal: No identifiable suicidal ideation.  Patients presenting with no risk factors but with morbid ruminations; may be classified as minimal risk based on the severity of the depressive symptoms  Discharge Diagnoses:   AXIS I:  Major Depression, Recurrent severe AXIS II:  Deferred AXIS III:   Past Medical History  Diagnosis Date  . Brain tumor   . Seizures   . Back pain   . Hypertension   . CHF (congestive heart failure)   . Depression    AXIS IV:  other psychosocial or environmental problems, problems related to social environment and problems with primary support group AXIS V:  61-70 mild symptoms  Plan Of Care/Follow-up recommendations:  Activity:  As tolerated Diet:  Regular  Is patient on multiple antipsychotic therapies at discharge:  No   Has Patient had three or more failed trials of antipsychotic monotherapy by history:  No  Recommended Plan for Multiple Antipsychotic Therapies: NA    Debra Colon,JANARDHAHA R. 09/20/2013, 12:58 PM
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
## 2013-09-20 NOTE — Progress Notes (Signed)
Patient ID: Claire Ponce, female   DOB: 1976/01/28, 38 y.o.   MRN: [id number] Patient discharged per physician order; patient denies SI/HI and A/V hallucinations; patient received samples, prescriptions, and copy of AVS after it was reviewed; patient had no other questions or concerns at this time; patient verbalized and signed that she received all belongings; patient left the unit ambulatory

## 2013-09-20 NOTE — Progress Notes (Signed)
[facility] Adult Case Management Discharge Plan :  Will you be returning to the same living situation after discharge: Yes,  pt was homeless prior to admission and will continue to be homeless.  CSW provided pt with resources in the community and pt verbalizes a plan to stay at the shelter At discharge, do you have transportation home?:Yes,  provided pt with a bus pass Do you have the ability to pay for your medications:Yes,  provided pt with samples and prescriptions and referred pt to [facility] for assistance with affording meds  Release of information consent forms completed and in the chart;  Patient's signature needed at discharge.  Patient to Follow up at: Follow-up Information   Follow up with [facility] of the [facility] On 09/22/2013. (Walk in on for a hospital discharge appointment. Walk in clinic is Monday - Friday 8 am - 3 pm. They will than schedule you for medication mangement and therapy. )    Contact information:   [address] [place] [address] Phone: [phone] Fax: [phone]      Patient denies SI/HI:   Yes,  denies SI/HI    Safety Planning and Suicide Prevention discussed:  Yes,  discussed with pt.  Pt refused consent to contact family/friend.  See suicide prevention education note.   Ane Payment 09/20/2013, 11:37 AM

## 2013-09-22 NOTE — Progress Notes (Signed)
Patient Discharge Instructions:  After Visit Summary (AVS):   Faxed to:  09/22/13 Discharge Summary Note:   Faxed to:  09/22/13 Psychiatric Admission Assessment Note:   Faxed to:  09/22/13 Suicide Risk Assessment - Discharge Assessment:   Faxed to:  09/22/13 Faxed/Sent to the Next Level Care provider:  09/22/13 Faxed to [facility], 09/22/2013, 3:55 PM

## 2013-10-02 ENCOUNTER — Encounter (HOSPITAL_COMMUNITY): Payer: Self-pay | Admitting: Emergency Medicine

## 2013-10-02 ENCOUNTER — Inpatient Hospital Stay (HOSPITAL_COMMUNITY)
Admission: EM | Admit: 2013-10-02 | Discharge: 2013-10-04 | DRG: 758 | Disposition: A | Discharge status: Home / Self Care | Payer: Medicaid Other | Attending: Obstetrics & Gynecology | Admitting: Obstetrics & Gynecology

## 2013-10-02 DIAGNOSIS — F17200 Nicotine dependence, unspecified, uncomplicated: Secondary | ICD-10-CM | POA: Diagnosis present

## 2013-10-02 DIAGNOSIS — Q445 Other congenital malformations of bile ducts: Secondary | ICD-10-CM

## 2013-10-02 DIAGNOSIS — Q447 Other congenital malformations of liver: Secondary | ICD-10-CM

## 2013-10-02 DIAGNOSIS — I10 Essential (primary) hypertension: Secondary | ICD-10-CM | POA: Diagnosis present

## 2013-10-02 DIAGNOSIS — N7093 Salpingitis and oophoritis, unspecified: Principal | ICD-10-CM | POA: Diagnosis present

## 2013-10-02 DIAGNOSIS — M51379 Other intervertebral disc degeneration, lumbosacral region without mention of lumbar back pain or lower extremity pain: Secondary | ICD-10-CM | POA: Diagnosis present

## 2013-10-02 DIAGNOSIS — R569 Unspecified convulsions: Secondary | ICD-10-CM | POA: Diagnosis present

## 2013-10-02 DIAGNOSIS — F329 Major depressive disorder, single episode, unspecified: Secondary | ICD-10-CM | POA: Diagnosis present

## 2013-10-02 DIAGNOSIS — Q441 Other congenital malformations of gallbladder: Secondary | ICD-10-CM

## 2013-10-02 DIAGNOSIS — R1900 Intra-abdominal and pelvic swelling, mass and lump, unspecified site: Secondary | ICD-10-CM | POA: Diagnosis present

## 2013-10-02 DIAGNOSIS — Q762 Congenital spondylolisthesis: Secondary | ICD-10-CM

## 2013-10-02 DIAGNOSIS — Z9089 Acquired absence of other organs: Secondary | ICD-10-CM

## 2013-10-02 DIAGNOSIS — F3289 Other specified depressive episodes: Secondary | ICD-10-CM | POA: Diagnosis present

## 2013-10-02 DIAGNOSIS — Q4479 Other congenital malformations of liver: Secondary | ICD-10-CM

## 2013-10-02 DIAGNOSIS — M5137 Other intervertebral disc degeneration, lumbosacral region: Secondary | ICD-10-CM | POA: Diagnosis present

## 2013-10-02 DIAGNOSIS — Z85841 Personal history of malignant neoplasm of brain: Secondary | ICD-10-CM

## 2013-10-02 LAB — CBC WITH DIFFERENTIAL/PLATELET
BASOS ABS: 0.1 10*3/uL (ref 0.0–0.1)
Basophils Relative: 1 % (ref 0–1)
Eosinophils Absolute: 0.4 10*3/uL (ref 0.0–0.7)
Eosinophils Relative: 5 % (ref 0–5)
HCT: 42.6 % (ref 36.0–46.0)
HEMOGLOBIN: 14.7 g/dL (ref 12.0–15.0)
LYMPHS PCT: 30 % (ref 12–46)
Lymphs Abs: 2.6 10*3/uL (ref 0.7–4.0)
MCH: 32.5 pg (ref 26.0–34.0)
MCHC: 34.5 g/dL (ref 30.0–36.0)
MCV: 94.2 fL (ref 78.0–100.0)
MONOS PCT: 5 % (ref 3–12)
Monocytes Absolute: 0.5 10*3/uL (ref 0.1–1.0)
NEUTROS ABS: 5.2 10*3/uL (ref 1.7–7.7)
NEUTROS PCT: 59 % (ref 43–77)
Platelets: 231 10*3/uL (ref 150–400)
RBC: 4.52 MIL/uL (ref 3.87–5.11)
RDW: 12.6 % (ref 11.5–15.5)
WBC: 8.7 10*3/uL (ref 4.0–10.5)

## 2013-10-02 LAB — COMPREHENSIVE METABOLIC PANEL
ALBUMIN: 3.5 g/dL (ref 3.5–5.2)
ALK PHOS: 138 U/L — AB (ref 39–117)
ALT: 15 U/L (ref 0–35)
AST: 12 U/L (ref 0–37)
BILIRUBIN TOTAL: 0.3 mg/dL (ref 0.3–1.2)
BUN: 13 mg/dL (ref 6–23)
CHLORIDE: 107 meq/L (ref 96–112)
CO2: 23 meq/L (ref 19–32)
Calcium: 10.6 mg/dL — ABNORMAL HIGH (ref 8.4–10.5)
Creatinine, Ser: 0.7 mg/dL (ref 0.50–1.10)
GFR calc Af Amer: >90 mL/min (ref >90)
GFR calc non Af Amer: >90 mL/min (ref >90)
Glucose, Bld: 114 mg/dL — ABNORMAL HIGH (ref 70–99)
POTASSIUM: 4.3 meq/L (ref 3.7–5.3)
Sodium: 141 mEq/L (ref 137–147)
Total Protein: 7.1 g/dL (ref 6.0–8.3)

## 2013-10-02 NOTE — ED Notes (Signed)
Pt states she has had left sided abdominal pain for three days. States she has no relief with any medications. Reports nausea. When asked if she could be pregnant patient states she could be. Bowel sounds audible, denies diarrhea.

## 2013-10-02 NOTE — ED Notes (Signed)
Pt to department via EMS- states that she has had abd pain and vaginal bleeding for the past 3 days. Reports that she was seen at [facility] for the same pain and told that she has ovarian cysts. States that she is out of her pain medication and needs something else for pain. [id number]

## 2013-10-02 NOTE — ED Notes (Signed)
Pt asked to pee, states she does not want to right now.

## 2013-10-02 NOTE — ED Notes (Addendum)
Pt reports vaginal spotting yesterday. States they told her she had cysts. States her percocet's are not working and that she needs stronger pain medications.

## 2013-10-03 ENCOUNTER — Emergency Department (HOSPITAL_COMMUNITY): Payer: Medicaid Other

## 2013-10-03 ENCOUNTER — Encounter (HOSPITAL_COMMUNITY): Payer: Self-pay | Admitting: Radiology

## 2013-10-03 DIAGNOSIS — N7093 Salpingitis and oophoritis, unspecified: Secondary | ICD-10-CM | POA: Diagnosis present

## 2013-10-03 LAB — URINALYSIS, ROUTINE W REFLEX MICROSCOPIC
BILIRUBIN URINE: NEGATIVE
GLUCOSE, UA: NEGATIVE mg/dL
Hgb urine dipstick: NEGATIVE
KETONES UR: NEGATIVE mg/dL
Leukocytes, UA: NEGATIVE
Nitrite: NEGATIVE
Protein, ur: NEGATIVE mg/dL
Specific Gravity, Urine: 1.025 (ref 1.005–1.030)
Urobilinogen, UA: 0.2 mg/dL (ref 0.0–1.0)
pH: 5 (ref 5.0–8.0)

## 2013-10-03 LAB — POC URINE PREG, ED: Preg Test, Ur: NEGATIVE

## 2013-10-03 MED ORDER — HYDROMORPHONE HCL PF 1 MG/ML IJ SOLN
1.0000 mg | Freq: Once | INTRAMUSCULAR | Status: AC
  Administered 2013-10-03: 1 mg via INTRAVENOUS
  Filled 2013-10-03: qty 1

## 2013-10-03 MED ORDER — SIMVASTATIN 40 MG PO TABS
40.0000 mg | ORAL_TABLET | Freq: Every day | ORAL | Status: DC
  Administered 2013-10-03: 40 mg via ORAL
  Filled 2013-10-03: qty 1

## 2013-10-03 MED ORDER — HYDROMORPHONE HCL 2 MG PO TABS
2.0000 mg | ORAL_TABLET | ORAL | Status: DC | PRN
  Administered 2013-10-03 – 2013-10-04 (×4): 2 mg via ORAL
  Filled 2013-10-03 (×5): qty 1

## 2013-10-03 MED ORDER — OXYCODONE-ACETAMINOPHEN 5-325 MG PO TABS
1.0000 | ORAL_TABLET | ORAL | Status: DC | PRN
  Administered 2013-10-03: 2 via ORAL
  Filled 2013-10-03: qty 2

## 2013-10-03 MED ORDER — PHENYTOIN SODIUM EXTENDED 100 MG PO CAPS
100.0000 mg | ORAL_CAPSULE | Freq: Three times a day (TID) | ORAL | Status: DC
  Administered 2013-10-03 – 2013-10-04 (×2): 100 mg via ORAL
  Filled 2013-10-03 (×2): qty 1

## 2013-10-03 MED ORDER — HYDROXYZINE HCL 25 MG PO TABS
25.0000 mg | ORAL_TABLET | Freq: Four times a day (QID) | ORAL | Status: DC | PRN
  Filled 2013-10-03: qty 1

## 2013-10-03 MED ORDER — METOCLOPRAMIDE HCL 10 MG PO TABS: 10.0000 mg | ORAL_TABLET | Freq: Four times a day (QID) | ORAL | Status: DC | PRN

## 2013-10-03 MED ORDER — ONDANSETRON HCL 4 MG/2ML IJ SOLN: 4.0000 mg | Freq: Four times a day (QID) | INTRAMUSCULAR | Status: DC | PRN

## 2013-10-03 MED ORDER — SODIUM CHLORIDE 0.9 % IJ SOLN: 3.0000 mL | Freq: Two times a day (BID) | INTRAMUSCULAR | Status: DC

## 2013-10-03 MED ORDER — DOXYCYCLINE HYCLATE 100 MG IV SOLR: 200.0000 mg | Freq: Two times a day (BID) | INTRAVENOUS | Status: DC

## 2013-10-03 MED ORDER — IBUPROFEN 800 MG PO TABS: 800.0000 mg | ORAL_TABLET | Freq: Three times a day (TID) | ORAL | Status: DC | PRN

## 2013-10-03 MED ORDER — LISINOPRIL 20 MG PO TABS
40.0000 mg | ORAL_TABLET | Freq: Every day | ORAL | Status: DC
  Filled 2013-10-03: qty 1

## 2013-10-03 MED ORDER — ALBUTEROL SULFATE (2.5 MG/3ML) 0.083% IN NEBU: 3.0000 mL | INHALATION_SOLUTION | RESPIRATORY_TRACT | Status: DC | PRN

## 2013-10-03 MED ORDER — METRONIDAZOLE IN NACL 5-0.79 MG/ML-% IV SOLN
500.0000 mg | Freq: Three times a day (TID) | INTRAVENOUS | Status: DC
  Administered 2013-10-03 – 2013-10-04 (×4): 500 mg via INTRAVENOUS
  Filled 2013-10-03 (×5): qty 100

## 2013-10-03 MED ORDER — PRENATAL MULTIVITAMIN CH
1.0000 | ORAL_TABLET | Freq: Every day | ORAL | Status: DC
  Administered 2013-10-03: 1 via ORAL
  Filled 2013-10-03: qty 1

## 2013-10-03 MED ORDER — ONDANSETRON HCL 4 MG/2ML IJ SOLN
4.0000 mg | Freq: Once | INTRAMUSCULAR | Status: AC
  Administered 2013-10-03: 4 mg via INTRAVENOUS
  Filled 2013-10-03: qty 2

## 2013-10-03 MED ORDER — EPINEPHRINE 0.3 MG/0.3ML IJ SOAJ: 0.3000 mg | Freq: Once | INTRAMUSCULAR | Status: DC

## 2013-10-03 MED ORDER — SODIUM CHLORIDE 0.9 % IJ SOLN: 3.0000 mL | INTRAMUSCULAR | Status: DC | PRN

## 2013-10-03 MED ORDER — SODIUM CHLORIDE 0.9 % IV SOLN: 250.0000 mL | INTRAVENOUS | Status: DC | PRN

## 2013-10-03 MED ORDER — IOHEXOL 300 MG/ML  SOLN
20.0000 mL | INTRAMUSCULAR | Status: AC
  Administered 2013-10-03: 20 mL via ORAL

## 2013-10-03 MED ORDER — ONDANSETRON HCL 4 MG PO TABS: 4.0000 mg | ORAL_TABLET | Freq: Four times a day (QID) | ORAL | Status: DC | PRN

## 2013-10-03 MED ORDER — PRAZOSIN HCL 1 MG PO CAPS
1.0000 mg | ORAL_CAPSULE | Freq: Every day | ORAL | Status: DC
  Administered 2013-10-03: 1 mg via ORAL
  Filled 2013-10-03: qty 1

## 2013-10-03 MED ORDER — TRAZODONE HCL 50 MG PO TABS
100.0000 mg | ORAL_TABLET | Freq: Every evening | ORAL | Status: DC | PRN
  Filled 2013-10-03: qty 2

## 2013-10-03 MED ORDER — CIPROFLOXACIN IN D5W 400 MG/200ML IV SOLN
400.0000 mg | Freq: Two times a day (BID) | INTRAVENOUS | Status: DC
  Administered 2013-10-03 – 2013-10-04 (×3): 400 mg via INTRAVENOUS
  Filled 2013-10-03 (×3): qty 200

## 2013-10-03 MED ORDER — FLUOXETINE HCL 20 MG PO CAPS
20.0000 mg | ORAL_CAPSULE | Freq: Every day | ORAL | Status: DC
  Filled 2013-10-03: qty 1

## 2013-10-03 MED ORDER — IOHEXOL 300 MG/ML  SOLN
100.0000 mL | Freq: Once | INTRAMUSCULAR | Status: AC | PRN
  Administered 2013-10-03: 100 mL via INTRAVENOUS

## 2013-10-03 MED ORDER — PANTOPRAZOLE SODIUM 40 MG PO TBEC
80.0000 mg | DELAYED_RELEASE_TABLET | Freq: Every day | ORAL | Status: DC
  Administered 2013-10-03 – 2013-10-04 (×2): 80 mg via ORAL
  Filled 2013-10-03 (×2): qty 2

## 2013-10-03 NOTE — ED Notes (Signed)
Discussed plan of care with Dr. Roxanne Mins.  Patient is NPO for now because she may have surgery.  Informed patient of the plan of care.

## 2013-10-03 NOTE — ED Notes (Signed)
Discussed need to transfer, and patient signed consent form.

## 2013-10-03 NOTE — ED Notes (Signed)
Pt done with contrast.

## 2013-10-03 NOTE — ED Notes (Signed)
Pt voided on on. In and out not needed.

## 2013-10-03 NOTE — MAU Note (Signed)
Dr. Hulan Fray called to MAU to have pt transferred up to 3rd floor.

## 2013-10-03 NOTE — MAU Note (Signed)
Pt sent from [facility] via [facility]. Pt reports R abdominal pain and some nausea

## 2013-10-03 NOTE — ED Provider Notes (Signed)
CSN: [id number]     Arrival date & time 10/02/13  1755 History   First MD Initiated Contact with Patient 10/02/13 2345     Chief Complaint  Patient presents with  . Abdominal Pain     (Consider location/radiation/quality/duration/timing/severity/associated sxs/prior Treatment) Patient is a 38 y.o. female presenting with abdominal pain. The history is provided by the patient.  Abdominal Pain She has been having pain across her lower abdomen for the last 3 days and is getting worse. Pain is dull and severe and she rates it at 10/10. There is no radiation to the upper abdomen or flanks. There is associated nausea and vomiting. She had slight vaginal bleeding yesterday but not has not had any today. She denies fever, chills, sweats. She had been seen at [facility] hospital several weeks ago for similar pain he been treated for possible PID. She has been taking oxycodone-acetaminophen for pain with only slight relief. Pain is worse with any movement but nothing makes it any better.  Past Medical History  Diagnosis Date  . Brain tumor   . Seizures   . Back pain   . Hypertension   . CHF (congestive heart failure)   . Depression    Past Surgical History  Procedure Laterality Date  . Brain surgery     Family History  Problem Relation Age of Onset  . Hypertension Mother   . Diabetes Mother   . Cancer Father   . Heart disease Father   . Hyperlipidemia Father   . Hypertension Father   . Hyperlipidemia Brother   . Hypertension Brother    History  Substance Use Topics  . Smoking status: Current Every Day Smoker    Types: Cigarettes  . Smokeless tobacco: Not on file  . Alcohol Use: No   OB History   Grav Para Term Preterm Abortions TAB SAB Ect Mult Living   2 2 2       2      Review of Systems  Gastrointestinal: Positive for abdominal pain.  All other systems reviewed and are negative.      Allergies  Bee venom; Penicillins; Toradol; Latex; Morphine and related; Peach;  Topamax; Tramadol; and Vanilla  Home Medications   Current Outpatient Rx  Name  Route  Sig  Dispense  Refill  . albuterol (PROVENTIL HFA;VENTOLIN HFA) 108 (90 BASE) MCG/ACT inhaler   Inhalation   Inhale 1-2 puffs into the lungs every 4 (four) hours as needed for wheezing or shortness of breath.   1 Inhaler   0   . esomeprazole (NEXIUM) 40 MG capsule   Oral   Take 1 capsule (40 mg total) by mouth daily at 12 noon.   30 capsule   0   . FLUoxetine (PROZAC) 20 MG capsule   Oral   Take 1 capsule (20 mg total) by mouth daily.   30 capsule   0   . hydrOXYzine (ATARAX/VISTARIL) 25 MG tablet   Oral   Take 1 tablet (25 mg total) by mouth every 6 (six) hours as needed for anxiety.   14 tablet   0   . lisinopril (PRINIVIL,ZESTRIL) 40 MG tablet   Oral   Take 1 tablet (40 mg total) by mouth daily.   30 tablet   0   . metoCLOPramide (REGLAN) 10 MG tablet   Oral   Take 10 mg by mouth every 6 (six) hours as needed for nausea (nausea/headache).         . phenytoin (DILANTIN) 100 MG  ER capsule   Oral   Take 1 capsule (100 mg total) by mouth 3 (three) times daily.   90 capsule   1   . prazosin (MINIPRESS) 1 MG capsule   Oral   Take 1 capsule (1 mg total) by mouth at bedtime.   30 capsule   0   . simvastatin (ZOCOR) 40 MG tablet   Oral   Take 1 tablet (40 mg total) by mouth daily.   30 tablet   1   . traZODone (DESYREL) 100 MG tablet   Oral   Take 1 tablet (100 mg total) by mouth at bedtime as needed for sleep.   7 tablet   0   . EPINEPHrine (EPIPEN) 0.3 mg/0.3 mL SOAJ injection   Intramuscular   Inject 0.3 mLs (0.3 mg total) into the muscle once.   1 Device   1   . oxyCODONE-acetaminophen (PERCOCET/ROXICET) 5-325 MG per tablet   Oral   Take 1 tablet by mouth every 4 (four) hours as needed for severe pain.   10 tablet   0    BP 127/72  Pulse 113  Temp(Src) 98.2 F (36.8 C) (Oral)  Resp 18  Ht 5\' 7"  (1.702 m)  Wt 260 lb (117.935 kg)  BMI 40.71 kg/m2   SpO2 97%  LMP 09/03/2013 Physical Exam  Nursing note and vitals reviewed.  38 year old female, resting comfortably and in no acute distress. Vital signs are significant for tachycardia with heart rate 113. Oxygen saturation is 97%, which is normal. Head is normocephalic and atraumatic. PERRLA, EOMI. Oropharynx is clear. Neck is nontender and supple without adenopathy or JVD. Back is nontender and there is no CVA tenderness. Lungs are clear without rales, wheezes, or rhonchi. Chest is nontender. Heart has regular rate and rhythm without murmur. Abdomen is soft, flat, with tenderness across the suprapubic area. Worse tenderness is in the right lower quadrant. There is no definite rebound or guarding. There are no masses or hepatosplenomegaly and peristalsis is hypoactive. Extremities have no cyanosis or edema, full range of motion is present. Skin is warm and dry without rash. Neurologic: Mental status is normal, cranial nerves are intact, there are no motor or sensory deficits.  ED Course  Procedures (including critical care time) Labs Review Results for orders placed during the hospital encounter of 10/02/13  CBC WITH DIFFERENTIAL      Result Value Ref Range   WBC 8.7  4.0 - 10.5 K/uL   RBC 4.52  3.87 - 5.11 MIL/uL   Hemoglobin 14.7  12.0 - 15.0 g/dL   HCT 42.6  36.0 - 46.0 %   MCV 94.2  78.0 - 100.0 fL   MCH 32.5  26.0 - 34.0 pg   MCHC 34.5  30.0 - 36.0 g/dL   RDW 12.6  11.5 - 15.5 %   Platelets 231  150 - 400 K/uL   Neutrophils Relative % 59  43 - 77 %   Neutro Abs 5.2  1.7 - 7.7 K/uL   Lymphocytes Relative 30  12 - 46 %   Lymphs Abs 2.6  0.7 - 4.0 K/uL   Monocytes Relative 5  3 - 12 %   Monocytes Absolute 0.5  0.1 - 1.0 K/uL   Eosinophils Relative 5  0 - 5 %   Eosinophils Absolute 0.4  0.0 - 0.7 K/uL   Basophils Relative 1  0 - 1 %   Basophils Absolute 0.1  0.0 - 0.1 K/uL  COMPREHENSIVE METABOLIC  PANEL      Result Value Ref Range   Sodium 141  137 - 147 mEq/L    Potassium 4.3  3.7 - 5.3 mEq/L   Chloride 107  96 - 112 mEq/L   CO2 23  19 - 32 mEq/L   Glucose, Bld 114 (*) 70 - 99 mg/dL   BUN 13  6 - 23 mg/dL   Creatinine, Ser 0.70  0.50 - 1.10 mg/dL   Calcium 10.6 (*) 8.4 - 10.5 mg/dL   Total Protein 7.1  6.0 - 8.3 g/dL   Albumin 3.5  3.5 - 5.2 g/dL   AST 12  0 - 37 U/L   ALT 15  0 - 35 U/L   Alkaline Phosphatase 138 (*) 39 - 117 U/L   Total Bilirubin 0.3  0.3 - 1.2 mg/dL   GFR calc non Af Amer >90  >90 mL/min   GFR calc Af Amer >90  >90 mL/min  URINALYSIS, ROUTINE W REFLEX MICROSCOPIC      Result Value Ref Range   Color, Urine YELLOW  YELLOW   APPearance CLOUDY (*) CLEAR   Specific Gravity, Urine 1.025  1.005 - 1.030   pH 5.0  5.0 - 8.0   Glucose, UA NEGATIVE  NEGATIVE mg/dL   Hgb urine dipstick NEGATIVE  NEGATIVE   Bilirubin Urine NEGATIVE  NEGATIVE   Ketones, ur NEGATIVE  NEGATIVE mg/dL   Protein, ur NEGATIVE  NEGATIVE mg/dL   Urobilinogen, UA 0.2  0.0 - 1.0 mg/dL   Nitrite NEGATIVE  NEGATIVE   Leukocytes, UA NEGATIVE  NEGATIVE  POC URINE PREG, ED      Result Value Ref Range   Preg Test, Ur NEGATIVE  NEGATIVE   Imaging Review Ct Abdomen Pelvis W Contrast  10/03/2013   CLINICAL DATA:  Three day history of abdominal pain and vaginal bleeding. Past history of ovarian cysts.  EXAM: CT ABDOMEN AND PELVIS WITH CONTRAST  TECHNIQUE: Multidetector CT imaging of the abdomen and pelvis was performed using the standard protocol following bolus administration of intravenous contrast.  CONTRAST:  125mL OMNIPAQUE IOHEXOL 300 MG/ML  SOLN  COMPARISON:  Recent pelvic ultrasound 09/14/2013  FINDINGS: Lower Chest: 3.7 by 1.8 cm pulmonary cyst versus new mass steal in the inferior-most aspect of the right lower lobe. Otherwise, the visualized lower lungs are clear. Imaged cardiac structures are within normal limits for size. No pericardial effusion. Unremarkable distal thoracic esophagus.  Abdomen: Unremarkable CT appearance of the stomach, duodenum, spleen,  adrenal glands and pancreas. Normal hepatic contour and morphology. No discrete hepatic lesion. Patient is status post cholecystectomy. Mild dilatation of the common bile duct to 11 mm at the pancreatic head. The duct tapers abruptly to normal caliber at the ampulla. No obstructing mass or choledocholithiasis visualized. Unremarkable appearance of the bilateral kidneys. No focal solid lesion, hydronephrosis or nephrolithiasis. Ventriculoperitoneal shunt catheter enters the abdomen in the left lower quadrant. The tip of the catheter terminates in the left upper quadrant just inferior to the spleen.  No evidence of obstruction or focal bowel wall thickening. Normal appendix in the right lower quadrant. The terminal ileum is unremarkable. No free fluid or suspicious adenopathy.  Pelvis: Complex fluid-filled tortuous tubular structure in the rectouterine recess of Douglas. Measured as a single mass, the structure measures 8.9 by 5.3 by 4.3 cm. The structure is inseparable from both adnexa.  Bones/Soft Tissues: Chronic bilateral L5 pars defects. Mild grade 1 anterolisthesis of L5 on S1 with associated degenerative disc disease. No acute  fracture or aggressive lytic or blastic osseous lesion.  Vascular: No significant atherosclerotic vascular disease, aneurysmal dilatation or acute abnormality.  IMPRESSION: 1. Complex fluid-filled tortuous tubular structure versus multiloculated cystic lesion in the recto uterine recess of Douglas. The mass is inseparable from both the right and left adnexa. Measured as a single unit, it measures 8.9 x 5.3 x 4.3 cm. Differential considerations include bilateral hydro/pyosalpinx, tubo-ovarian abscess, large complex peritoneal inclusion cyst, and potentially both benign and malignant ovarian cystic neoplasm. Given the large size of the abnormality, transvaginal ultrasound is likely insufficient for further evaluation. Recommend obstetrical surgical evaluation or further evaluation with MRI  of the pelvis with and without contrast. 2. 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 3. Common bile duct dilatation to 11 mm at the pancreatic head. This may be a normal finding in this patient status post cholecystectomy. Recommend clinical correlation with serum LFTs and bili Rubin. If abnormal, further evaluation with ERCP could be considered. 4. Right lower lobe pulmonary cyst. 5. Ventriculoperitoneal shunt catheter terminates in the left upper quadrant just inferior to the spleen.   Electronically Signed   By: Jacqulynn Cadet M.D.   On: 10/03/2013 02:41   MDM   Final diagnoses:  None    Lower abdominal pain which could represent inadequately treated for pelvic inflammatory disease. Old records are reviewed and at her visit to [facility] hospital on March 12, ultrasound showed a hydrosalpinx on the left. Since then, she has also been seen in the ED and sent to behavioral health hospital for suicidal ideation. She will be sent for CT today to rule out appendicitis. May need to consider repeat ultrasound to see if she has persistent hydrosalpinx which would require evaluation by GYN.  CT shows complex cystic mass which is 8.9 x 5.3 x 4.3 cm. Case is discussed with Dr. Hulan Fray of GYN service who requests the patient be transferred to [facility] hospital for further evaluation.  Delora Fuel, MD 27/07/86 [id number]

## 2013-10-03 NOTE — ED Notes (Signed)
CT paged. 

## 2013-10-04 DIAGNOSIS — I10 Essential (primary) hypertension: Secondary | ICD-10-CM

## 2013-10-04 DIAGNOSIS — Q4479 Other congenital malformations of liver: Secondary | ICD-10-CM

## 2013-10-04 DIAGNOSIS — N7093 Salpingitis and oophoritis, unspecified: Secondary | ICD-10-CM

## 2013-10-04 DIAGNOSIS — Q441 Other congenital malformations of gallbladder: Secondary | ICD-10-CM

## 2013-10-04 DIAGNOSIS — Q445 Other congenital malformations of bile ducts: Secondary | ICD-10-CM

## 2013-10-04 DIAGNOSIS — R569 Unspecified convulsions: Secondary | ICD-10-CM

## 2013-10-04 DIAGNOSIS — Q447 Other congenital malformations of liver: Secondary | ICD-10-CM

## 2013-10-04 DIAGNOSIS — R1900 Intra-abdominal and pelvic swelling, mass and lump, unspecified site: Secondary | ICD-10-CM

## 2013-10-04 MED ORDER — OXYCODONE-ACETAMINOPHEN 5-325 MG PO TABS: 1.0000 | ORAL_TABLET | ORAL | Status: DC | PRN

## 2013-10-04 MED ORDER — METRONIDAZOLE 500 MG PO TABS: 500.0000 mg | ORAL_TABLET | Freq: Two times a day (BID) | ORAL | Status: AC

## 2013-10-04 MED ORDER — CIPROFLOXACIN HCL 500 MG PO TABS: 500.0000 mg | ORAL_TABLET | Freq: Two times a day (BID) | ORAL | Status: DC

## 2013-10-04 NOTE — Discharge Summary (Signed)
Physician Discharge Summary  Patient ID: Claire Ponce MRN: [id number] DOB/AGE: [birth year] 38 y.o.  Admit date: 10/02/2013 Discharge date: 10/04/2013  Admission Diagnoses: Bilateral tuboovarian abscess; adnexal mass  Discharge Diagnoses:  Bilateral tuboovarian abscess; adnexal mass  Active Problems:   Pyosalpinx   Discharged Condition: good  Hospital Course: Pt was transferred from [facility] for adb pain and suspected TOA.  She was placed on IV atbx.  This am I was called to pts room as she was planning to leave AMA.  She reports that she is greatly improved since admission and that she cannot stay because she has an important appt on Friday.  I told her that we cannot be certain that this mass is not a malignancy but, she reports that she will f/u as an outpt.  She was offered [facility] transfer for an MRI today but, she declined.  Consented to take meds as an outpt.  She reports that she does have the ability to get meds as an outpt.  She denies N/V.  She also denies f/c.    Consults: None  Significant Diagnostic Studies: labs: CBC  Treatments: antibiotics: Cipro and metronidazole  Discharge Exam: Blood pressure 136/93, pulse 73, temperature 97.4 F (36.3 C), temperature source Oral, resp. rate 18, height 5\' 7"  (1.702 m), weight 260 lb (117.935 kg), last menstrual period 09/03/2013, SpO2 99.00%. General appearance: alert and no distress Resp: clear to auscultation bilaterally Cardio: regular rate and rhythm, S1, S2 normal, no murmur, click, rub or gallop GI: obese, NT, ND.  No rebound and no guarding Extremities: extremities normal, atraumatic, no cyanosis or edema  CBC    Component Value Date/Time   WBC 8.7 10/02/2013 1803   RBC 4.52 10/02/2013 1803   HGB 14.7 10/02/2013 1803   HCT 42.6 10/02/2013 1803   PLT 231 10/02/2013 1803   MCV 94.2 10/02/2013 1803   MCH 32.5 10/02/2013 1803   MCHC 34.5 10/02/2013 1803   RDW 12.6 10/02/2013 1803   LYMPHSABS 2.6 10/02/2013 1803   MONOABS 0.5  10/02/2013 1803   EOSABS 0.4 10/02/2013 1803   BASOSABS 0.1 10/02/2013 1803      Disposition: 01-Home or Self Care  Discharge Orders   Future Appointments Provider Department Dept Phone   10/05/2013 2:00 PM Donnamae Jude, MD [facility] [phone]   Future Orders Complete By Expires   Activity as tolerated - No restrictions  As directed    Call MD for:  persistant nausea and vomiting  As directed    Call MD for:  severe uncontrolled pain  As directed    Call MD for:  temperature >100.4  As directed    Diet - low sodium heart healthy  As directed        Medication List         albuterol 108 (90 BASE) MCG/ACT inhaler  Commonly known as:  PROVENTIL HFA;VENTOLIN HFA  Inhale 1-2 puffs into the lungs every 4 (four) hours as needed for wheezing or shortness of breath.     ciprofloxacin 500 MG tablet  Commonly known as:  CIPRO  Take 1 tablet (500 mg total) by mouth 2 (two) times daily.     EPINEPHrine 0.3 mg/0.3 mL Soaj injection  Commonly known as:  EPIPEN  Inject 0.3 mLs (0.3 mg total) into the muscle once.     esomeprazole 40 MG capsule  Commonly known as:  NEXIUM  Take 1 capsule (40 mg total) by mouth daily at 12 noon.  FLUoxetine 20 MG capsule  Commonly known as:  PROZAC  Take 1 capsule (20 mg total) by mouth daily.     hydrOXYzine 25 MG tablet  Commonly known as:  ATARAX/VISTARIL  Take 1 tablet (25 mg total) by mouth every 6 (six) hours as needed for anxiety.     lisinopril 40 MG tablet  Commonly known as:  PRINIVIL,ZESTRIL  Take 1 tablet (40 mg total) by mouth daily.     metoCLOPramide 10 MG tablet  Commonly known as:  REGLAN  Take 10 mg by mouth every 6 (six) hours as needed for nausea (nausea/headache).     metroNIDAZOLE 500 MG tablet  Commonly known as:  FLAGYL  Take 1 tablet (500 mg total) by mouth 2 (two) times daily.     oxyCODONE-acetaminophen 5-325 MG per tablet  Commonly known as:  PERCOCET/ROXICET  Take 1 tablet by mouth every 4  (four) hours as needed for severe pain.     oxyCODONE-acetaminophen 5-325 MG per tablet  Commonly known as:  PERCOCET/ROXICET  Take 1-2 tablets by mouth every 3 (three) hours as needed for severe pain (moderate to severe pain (when tolerating fluids)).     phenytoin 100 MG ER capsule  Commonly known as:  DILANTIN  Take 1 capsule (100 mg total) by mouth 3 (three) times daily.     prazosin 1 MG capsule  Commonly known as:  MINIPRESS  Take 1 capsule (1 mg total) by mouth at bedtime.     simvastatin 40 MG tablet  Commonly known as:  ZOCOR  Take 1 tablet (40 mg total) by mouth daily.     traZODone 100 MG tablet  Commonly known as:  DESYREL  Take 1 tablet (100 mg total) by mouth at bedtime as needed for sleep.           Follow-up Information   Follow up with [facility] In 2 weeks.      Signed: HARRAWAY-SMITH, Virgie Kunda 10/04/2013, 11:58 AM

## 2013-10-04 NOTE — Discharge Instructions (Signed)
Pelvic Inflammatory Disease Pelvic inflammatory disease (PID) refers to an infection in some or all of the female organs. The infection can be in the uterus, ovaries, fallopian tubes, or the surrounding tissues in the pelvis. PID can cause abdominal or pelvic pain that comes on suddenly (acute pelvic pain). PID is a serious infection because it can lead to lasting (chronic) pelvic pain or the inability to have children (infertile).  CAUSES  The infection is often caused by the normal bacteria found in the vaginal tissues. PID may also be caused by an infection that is spread during sexual contact. PID can also occur following:   The birth of a baby.   A miscarriage.   An abortion.   Major pelvic surgery.   The use of an intrauterine device (IUD).   A sexual assault.  RISK FACTORS Certain factors can put a person at higher risk for PID, such as:  Being younger than 25 years.  Being sexually active at [place] age.  Usingnonbarrier contraception.  Havingmultiple sexual partners.  Having sex with someone who has symptoms of a genital infection.  Using oral contraception. Other times, certain behaviors can increase the possibility of getting PID, such as:  Having sex during your period.  Using a vaginal douche.  Having an intrauterine device (IUD) in place. SYMPTOMS   Abdominal or pelvic pain.   Fever.   Chills.   Abnormal vaginal discharge.  Abnormal uterine bleeding.   Unusual pain shortly after finishing your period. DIAGNOSIS  Your caregiver will choose some of the following methods to make a diagnosis, such as:   Performinga physical exam and history. A pelvic exam typically reveals a very tender uterus and surrounding pelvis.   Ordering laboratory tests including a pregnancy test, blood tests, and urine test.  Orderingcultures of the vagina and cervix to check for a sexually transmitted infection (STI).  Performing an ultrasound.    Performing a laparoscopic procedure to look inside the pelvis.  TREATMENT   Antibiotic medicines may be prescribed and taken by mouth.   Sexual partners may be treated when the infection is caused by a sexually transmitted disease (STD).   Hospitalization may be needed to give antibiotics intravenously.  Surgery may be needed, but this is rare. It may take weeks until you are completely well. If you are diagnosed with PID, you should also be checked for human immunodeficiency virus (HIV). HOME CARE INSTRUCTIONS   If given, take your antibiotics as directed. Finish the medicine even if you start to feel better.   Only take over-the-counter or prescription medicines for pain, discomfort, or fever as directed by your caregiver.   Do not have sexual intercourse until treatment is completed or as directed by your caregiver. If PID is confirmed, your recent sexual partner(s) will need treatment.   Keep your follow-up appointments. SEEK MEDICAL CARE IF:   You have increased or abnormal vaginal discharge.   You need prescription medicine for your pain.   You vomit.   You cannot take your medicines.   Your partner has an STD.  SEEK IMMEDIATE MEDICAL CARE IF:   You have a fever.   You have increased abdominal or pelvic pain.   You have chills.   You have pain when you urinate.   You are not better after 72 hours following treatment.  MAKE SURE YOU:   Understand these instructions.  Will watch your condition.  Will get help right away if you are not doing well or get worse.  pelvic pain.    · You have chills.    · You have pain when you urinate.    · You are not better after 72 hours following treatment.    MAKE SURE YOU:   · Understand these instructions.  · Will watch your condition.  · Will get help right away if you are not doing well or get worse.  Document Released: 06/22/2005 Document Revised: 10/17/2012 Document Reviewed: 06/18/2011  [employer]® Patient Information ©2014 [employer].

## 2013-10-04 NOTE — Progress Notes (Signed)
Pt discharged home... Condition stable... No equipment... Ambulated to cab with Astrid Divine, RN.

## 2013-10-04 NOTE — H&P (Signed)
Claire Ponce is an 38 y.o. female. Transferred from [place] to [facility] for ? bilat hydro vs pyosalpinx. See ED note.   Pertinent Gynecological History: OB History: [id number]   Menstrual History: Patient's last menstrual period was 09/03/2013.    Past Medical History  Diagnosis Date  . Brain tumor   . Seizures   . Back pain   . Hypertension   . CHF (congestive heart failure)   . Depression     Past Surgical History  Procedure Laterality Date  . Brain surgery      Family History  Problem Relation Age of Onset  . Hypertension Mother   . Diabetes Mother   . Cancer Father   . Heart disease Father   . Hyperlipidemia Father   . Hypertension Father   . Hyperlipidemia Brother   . Hypertension Brother     Social History:  reports that she has been smoking Cigarettes.  She has been smoking about 0.00 packs per day. She does not have any smokeless tobacco history on file. She reports that she does not drink alcohol or use illicit drugs.  Allergies:  Allergies  Allergen Reactions  . Bee Venom Anaphylaxis  . Penicillins Anaphylaxis  . Toradol [Ketorolac Tromethamine] Anaphylaxis  . Latex Hives  . Morphine And Related Other (See Comments)    Causes Cramping.  Marland Kitchen Peach [Prunus Persica] Swelling  . Topamax [Topiramate] Hives and Nausea And Vomiting  . Tramadol Hives  . Vanilla Itching, Nausea And Vomiting and Swelling    If ingested it causes nausea and vomiting. If in topical products causes localized itching and swelling.    Prescriptions prior to admission  Medication Sig Dispense Refill  . albuterol (PROVENTIL HFA;VENTOLIN HFA) 108 (90 BASE) MCG/ACT inhaler Inhale 1-2 puffs into the lungs every 4 (four) hours as needed for wheezing or shortness of breath.  1 Inhaler  0  . esomeprazole (NEXIUM) 40 MG capsule Take 1 capsule (40 mg total) by mouth daily at 12 noon.  30 capsule  0  . FLUoxetine (PROZAC) 20 MG capsule Take 1 capsule (20 mg total) by mouth daily.   30 capsule  0  . hydrOXYzine (ATARAX/VISTARIL) 25 MG tablet Take 1 tablet (25 mg total) by mouth every 6 (six) hours as needed for anxiety.  14 tablet  0  . lisinopril (PRINIVIL,ZESTRIL) 40 MG tablet Take 1 tablet (40 mg total) by mouth daily.  30 tablet  0  . metoCLOPramide (REGLAN) 10 MG tablet Take 10 mg by mouth every 6 (six) hours as needed for nausea (nausea/headache).      . phenytoin (DILANTIN) 100 MG ER capsule Take 1 capsule (100 mg total) by mouth 3 (three) times daily.  90 capsule  1  . prazosin (MINIPRESS) 1 MG capsule Take 1 capsule (1 mg total) by mouth at bedtime.  30 capsule  0  . simvastatin (ZOCOR) 40 MG tablet Take 1 tablet (40 mg total) by mouth daily.  30 tablet  1  . traZODone (DESYREL) 100 MG tablet Take 1 tablet (100 mg total) by mouth at bedtime as needed for sleep.  7 tablet  0  . EPINEPHrine (EPIPEN) 0.3 mg/0.3 mL SOAJ injection Inject 0.3 mLs (0.3 mg total) into the muscle once.  1 Device  1  . oxyCODONE-acetaminophen (PERCOCET/ROXICET) 5-325 MG per tablet Take 1 tablet by mouth every 4 (four) hours as needed for severe pain.  10 tablet  0    ROS  Blood pressure 141/82, pulse 61, temperature  97.8 F (36.6 C), temperature source Oral, resp. rate 20, height _0  (1.702 m), weight 117.935 kg (260 lb), last menstrual period 09/03/2013, SpO2 96.00%. Physical Exam   Results for orders placed during the hospital encounter of 10/02/13 (from the past 48 hour(s))  CBC WITH DIFFERENTIAL     Status: None   Collection Time    10/02/13  6:03 PM      Result Value Ref Range   WBC 8.7  4.0 - 10.5 K/uL   RBC 4.52  3.87 - 5.11 MIL/uL   Hemoglobin 14.7  12.0 - 15.0 g/dL   HCT 42.6  36.0 - 46.0 %   MCV 94.2  78.0 - 100.0 fL   MCH 32.5  26.0 - 34.0 pg   MCHC 34.5  30.0 - 36.0 g/dL   RDW 12.6  11.5 - 15.5 %   Platelets 231  150 - 400 K/uL   Neutrophils Relative % 59  43 - 77 %   Neutro Abs 5.2  1.7 - 7.7 K/uL   Lymphocytes Relative 30  12 - 46 %   Lymphs Abs 2.6  0.7 - 4.0  K/uL   Monocytes Relative 5  3 - 12 %   Monocytes Absolute 0.5  0.1 - 1.0 K/uL   Eosinophils Relative 5  0 - 5 %   Eosinophils Absolute 0.4  0.0 - 0.7 K/uL   Basophils Relative 1  0 - 1 %   Basophils Absolute 0.1  0.0 - 0.1 K/uL  COMPREHENSIVE METABOLIC PANEL     Status: Abnormal   Collection Time    10/02/13  6:03 PM      Result Value Ref Range   Sodium 141  137 - 147 mEq/L   Potassium 4.3  3.7 - 5.3 mEq/L   Chloride 107  96 - 112 mEq/L   CO2 23  19 - 32 mEq/L   Glucose, Bld 114 (*) 70 - 99 mg/dL   BUN 13  6 - 23 mg/dL   Creatinine, Ser 0.70  0.50 - 1.10 mg/dL   Calcium 10.6 (*) 8.4 - 10.5 mg/dL   Total Protein 7.1  6.0 - 8.3 g/dL   Albumin 3.5  3.5 - 5.2 g/dL   AST 12  0 - 37 U/L   ALT 15  0 - 35 U/L   Alkaline Phosphatase 138 (*) 39 - 117 U/L   Total Bilirubin 0.3  0.3 - 1.2 mg/dL   GFR calc non Af Amer >90  >90 mL/min   GFR calc Af Amer >90  >90 mL/min   Comment: (NOTE)     The eGFR has been calculated using the CKD EPI equation.     This calculation has not been validated in all clinical situations.     eGFR's persistently <90 mL/min signify possible Chronic Kidney     Disease.  URINALYSIS, ROUTINE W REFLEX MICROSCOPIC     Status: Abnormal   Collection Time    10/03/13 12:20 AM      Result Value Ref Range   Color, Urine YELLOW  YELLOW   APPearance CLOUDY (*) CLEAR   Specific Gravity, Urine 1.025  1.005 - 1.030   pH 5.0  5.0 - 8.0   Glucose, UA NEGATIVE  NEGATIVE mg/dL   Hgb urine dipstick NEGATIVE  NEGATIVE   Bilirubin Urine NEGATIVE  NEGATIVE   Ketones, ur NEGATIVE  NEGATIVE mg/dL   Protein, ur NEGATIVE  NEGATIVE mg/dL   Urobilinogen, UA 0.2  0.0 - 1.0 mg/dL  Nitrite NEGATIVE  NEGATIVE   Leukocytes, UA NEGATIVE  NEGATIVE   Comment: MICROSCOPIC NOT DONE ON URINES WITH NEGATIVE PROTEIN, BLOOD, LEUKOCYTES, NITRITE, OR GLUCOSE <1000 mg/dL.  POC URINE PREG, ED     Status: None   Collection Time    10/03/13 12:47 AM      Result Value Ref Range   Preg Test, Ur  NEGATIVE  NEGATIVE   Comment:            THE SENSITIVITY OF THIS     METHODOLOGY IS >24 mIU/mL    No results found for this or any previous visit (from the past 24 hour(s)).  Ct Abdomen Pelvis W Contrast  10/03/2013   CLINICAL DATA:  Three day history of abdominal pain and vaginal bleeding. Past history of ovarian cysts.  EXAM: CT ABDOMEN AND PELVIS WITH CONTRAST  TECHNIQUE: Multidetector CT imaging of the abdomen and pelvis was performed using the standard protocol following bolus administration of intravenous contrast.  CONTRAST:  145m OMNIPAQUE IOHEXOL 300 MG/ML  SOLN  COMPARISON:  Recent pelvic ultrasound 09/14/2013  FINDINGS: Lower Chest: 3.7 by 1.8 cm pulmonary cyst versus new mass steal in the inferior-most aspect of the right lower lobe. Otherwise, the visualized lower lungs are clear. Imaged cardiac structures are within normal limits for size. No pericardial effusion. Unremarkable distal thoracic esophagus.  Abdomen: Unremarkable CT appearance of the stomach, duodenum, spleen, adrenal glands and pancreas. Normal hepatic contour and morphology. No discrete hepatic lesion. Patient is status post cholecystectomy. Mild dilatation of the common bile duct to 11 mm at the pancreatic head. The duct tapers abruptly to normal caliber at the ampulla. No obstructing mass or choledocholithiasis visualized. Unremarkable appearance of the bilateral kidneys. No focal solid lesion, hydronephrosis or nephrolithiasis. Ventriculoperitoneal shunt catheter enters the abdomen in the left lower quadrant. The tip of the catheter terminates in the left upper quadrant just inferior to the spleen.  No evidence of obstruction or focal bowel wall thickening. Normal appendix in the right lower quadrant. The terminal ileum is unremarkable. No free fluid or suspicious adenopathy.  Pelvis: Complex fluid-filled tortuous tubular structure in the rectouterine recess of Douglas. Measured as a single mass, the structure measures 8.9  by 5.3 by 4.3 cm. The structure is inseparable from both adnexa.  Bones/Soft Tissues: Chronic bilateral L5 pars defects. Mild grade 1 anterolisthesis of L5 on S1 with associated degenerative disc disease. No acute fracture or aggressive lytic or blastic osseous lesion.  Vascular: No significant atherosclerotic vascular disease, aneurysmal dilatation or acute abnormality.  IMPRESSION: 1. Complex fluid-filled tortuous tubular structure versus multiloculated cystic lesion in the recto uterine recess of Douglas. The mass is inseparable from both the right and left adnexa. Measured as a single unit, it measures 8.9 x 5.3 x 4.3 cm. Differential considerations include bilateral hydro/pyosalpinx, tubo-ovarian abscess, large complex peritoneal inclusion cyst, and potentially both benign and malignant ovarian cystic neoplasm. Given the large size of the abnormality, transvaginal ultrasound is likely insufficient for further evaluation. Recommend obstetrical surgical evaluation or further evaluation with MRI of the pelvis with and without contrast. 2. 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 3. Common bile duct dilatation to 11 mm at the pancreatic head. This may be a normal finding in this patient status post cholecystectomy. Recommend clinical correlation with serum LFTs and bili Rubin. If abnormal, further evaluation with ERCP could be considered. 4. Right lower lobe pulmonary cyst. 5. Ventriculoperitoneal shunt  catheter terminates in the left upper quadrant just inferior to the spleen.   Electronically Signed   By: Jacqulynn Cadet M.D.   On: 10/03/2013 02:41    Assessment: 1. Pelvic mass    Plan:  1. Admit to [facility] unit per consult w/ Dr. Hulan Fray. 2. ABX.  Claire Ponce 10/04/2013, 1:12 AM

## 2013-10-05 ENCOUNTER — Telehealth: Payer: Self-pay

## 2013-10-05 ENCOUNTER — Encounter: Payer: Self-pay | Admitting: Family Medicine

## 2013-10-05 NOTE — Telephone Encounter (Signed)
Pt. Missed today's appointment to f/u for PID with Dr. Kennon Rounds. Attempted to call pt. Only number listed rang and then heard "the number or code you have dialed is incorrect." Will send letter.

## 2013-10-07 ENCOUNTER — Encounter (HOSPITAL_COMMUNITY): Payer: Self-pay | Admitting: Emergency Medicine

## 2013-10-07 ENCOUNTER — Inpatient Hospital Stay (HOSPITAL_COMMUNITY)
Admission: EM | Admit: 2013-10-07 | Discharge: 2013-10-08 | Discharge status: Against Medical Advice | Payer: Medicaid Other | Attending: Family Medicine | Admitting: Family Medicine

## 2013-10-07 ENCOUNTER — Inpatient Hospital Stay (HOSPITAL_COMMUNITY): Payer: Medicaid Other

## 2013-10-07 DIAGNOSIS — F3289 Other specified depressive episodes: Secondary | ICD-10-CM | POA: Diagnosis not present

## 2013-10-07 DIAGNOSIS — R109 Unspecified abdominal pain: Secondary | ICD-10-CM | POA: Diagnosis not present

## 2013-10-07 DIAGNOSIS — I509 Heart failure, unspecified: Secondary | ICD-10-CM | POA: Insufficient documentation

## 2013-10-07 DIAGNOSIS — Z8249 Family history of ischemic heart disease and other diseases of the circulatory system: Secondary | ICD-10-CM | POA: Insufficient documentation

## 2013-10-07 DIAGNOSIS — F329 Major depressive disorder, single episode, unspecified: Secondary | ICD-10-CM | POA: Diagnosis not present

## 2013-10-07 DIAGNOSIS — N7013 Chronic salpingitis and oophoritis: Secondary | ICD-10-CM | POA: Diagnosis present

## 2013-10-07 DIAGNOSIS — Z79899 Other long term (current) drug therapy: Secondary | ICD-10-CM | POA: Diagnosis not present

## 2013-10-07 DIAGNOSIS — F17200 Nicotine dependence, unspecified, uncomplicated: Secondary | ICD-10-CM | POA: Insufficient documentation

## 2013-10-07 DIAGNOSIS — I10 Essential (primary) hypertension: Secondary | ICD-10-CM | POA: Insufficient documentation

## 2013-10-07 DIAGNOSIS — N838 Other noninflammatory disorders of ovary, fallopian tube and broad ligament: Secondary | ICD-10-CM | POA: Diagnosis not present

## 2013-10-07 DIAGNOSIS — N949 Unspecified condition associated with female genital organs and menstrual cycle: Secondary | ICD-10-CM

## 2013-10-07 DIAGNOSIS — N7011 Chronic salpingitis: Secondary | ICD-10-CM

## 2013-10-07 LAB — CBC WITH DIFFERENTIAL/PLATELET
BASOS ABS: 0.1 10*3/uL (ref 0.0–0.1)
Basophils Relative: 1 % (ref 0–1)
EOS PCT: 6 % — AB (ref 0–5)
Eosinophils Absolute: 0.5 10*3/uL (ref 0.0–0.7)
HCT: 42.4 % (ref 36.0–46.0)
Hemoglobin: 14.2 g/dL (ref 12.0–15.0)
LYMPHS ABS: 2.6 10*3/uL (ref 0.7–4.0)
Lymphocytes Relative: 31 % (ref 12–46)
MCH: 31.4 pg (ref 26.0–34.0)
MCHC: 33.5 g/dL (ref 30.0–36.0)
MCV: 93.8 fL (ref 78.0–100.0)
Monocytes Absolute: 0.4 10*3/uL (ref 0.1–1.0)
Monocytes Relative: 5 % (ref 3–12)
Neutro Abs: 4.8 10*3/uL (ref 1.7–7.7)
Neutrophils Relative %: 58 % (ref 43–77)
PLATELETS: 229 10*3/uL (ref 150–400)
RBC: 4.52 MIL/uL (ref 3.87–5.11)
RDW: 12.5 % (ref 11.5–15.5)
WBC: 8.3 10*3/uL (ref 4.0–10.5)

## 2013-10-07 LAB — WET PREP, GENITAL
Clue Cells Wet Prep HPF POC: NONE SEEN
Trich, Wet Prep: NONE SEEN
Yeast Wet Prep HPF POC: NONE SEEN

## 2013-10-07 LAB — COMPREHENSIVE METABOLIC PANEL
ALT: 19 U/L (ref 0–35)
AST: 17 U/L (ref 0–37)
Albumin: 3.8 g/dL (ref 3.5–5.2)
Alkaline Phosphatase: 140 U/L — ABNORMAL HIGH (ref 39–117)
BUN: 10 mg/dL (ref 6–23)
CALCIUM: 11.2 mg/dL — AB (ref 8.4–10.5)
CO2: 24 mEq/L (ref 19–32)
CREATININE: 0.71 mg/dL (ref 0.50–1.10)
Chloride: 103 mEq/L (ref 96–112)
GFR calc non Af Amer: >90 mL/min (ref >90)
Glucose, Bld: 90 mg/dL (ref 70–99)
Potassium: 4.5 mEq/L (ref 3.7–5.3)
SODIUM: 137 meq/L (ref 137–147)
Total Bilirubin: 0.2 mg/dL — ABNORMAL LOW (ref 0.3–1.2)
Total Protein: 7.5 g/dL (ref 6.0–8.3)

## 2013-10-07 LAB — URINALYSIS, ROUTINE W REFLEX MICROSCOPIC
Bilirubin Urine: NEGATIVE
Glucose, UA: NEGATIVE mg/dL
Ketones, ur: NEGATIVE mg/dL
LEUKOCYTES UA: NEGATIVE
NITRITE: NEGATIVE
PH: 5.5 (ref 5.0–8.0)
Protein, ur: NEGATIVE mg/dL
SPECIFIC GRAVITY, URINE: 1.017 (ref 1.005–1.030)
Urobilinogen, UA: 0.2 mg/dL (ref 0.0–1.0)

## 2013-10-07 LAB — URINE MICROSCOPIC-ADD ON

## 2013-10-07 MED ORDER — FENTANYL CITRATE 0.05 MG/ML IJ SOLN
100.0000 ug | Freq: Once | INTRAMUSCULAR | Status: AC
  Administered 2013-10-07: 100 ug via INTRAVENOUS
  Filled 2013-10-07: qty 2

## 2013-10-07 MED ORDER — ONDANSETRON HCL 4 MG/2ML IJ SOLN
4.0000 mg | Freq: Once | INTRAMUSCULAR | Status: AC
  Administered 2013-10-07: 4 mg via INTRAVENOUS
  Filled 2013-10-07: qty 2

## 2013-10-07 MED ORDER — AZITHROMYCIN 250 MG PO TABS
1000.0000 mg | ORAL_TABLET | Freq: Once | ORAL | Status: AC
  Administered 2013-10-08: 1000 mg via ORAL
  Filled 2013-10-07: qty 4

## 2013-10-07 NOTE — ED Notes (Signed)
Unsuccessful IV attempt by this RN 

## 2013-10-07 NOTE — ED Notes (Signed)
Spoke with [facility] and made aware of need to [facility]

## 2013-10-07 NOTE — MAU Note (Addendum)
PT HAS BEEN TRANSFERRED BY  [facility]  FROM [facility]-     SAYS  SHE WENT TO [place]-  TO HAVE FRIEND SEEN BY DR  AT 530PM-  THEN  SHE BECAME FAINT  AND  ALMOST FELL IN FLOOR.    PT SAYS  SHE HAS LL ABD  PAIN   AND LAST TIME SHE WAS HERE - HER PAIN WAS ON THE  RIGHT .   LAST SEX-  6 YEARS AGO.

## 2013-10-07 NOTE — ED Notes (Signed)
[facility] and released a couple of days ago per pt. Pt is now experiencing the same pain on the left side starting at apx 1000 this am. Pt has complains of nausea

## 2013-10-07 NOTE — MAU Provider Note (Signed)
History     CSN: [id number]  Arrival date and time: 10/07/13 1845   First Provider Initiated Contact with Patient 10/07/13 2323      Chief Complaint  Patient presents with  . Abdominal Pain   HPI  Ms. Claire Ponce is a 38 y.o. female who presents as a transfer from [facility] for abdominal pain. On 3/31 the patient was admitted to the [facility] unit for treatment for PID; pt left AMA. She was referred to the [facility] clinic and on 4/2 she was a no show to her appointment. At the the time of discharge from the hospital she was given an RX for antibiotics and pain medication in which she did not fill. She states " I lost the prescriptions". The patient presented to [facility] with worsening abdominal pain in which she currently rates 10/10. She is leaving on Monday to [place] and is planning to stay out there for a few months; the patient would like to follow up with her OBGYN Dr. Trenton Founds there.  Denies intercourse in the last 6 years.   See recent CT scan.   OB History   Grav Para Term Preterm Abortions TAB SAB Ect Mult Living   _0 Past Medical History  Diagnosis Date  . Brain tumor   . Seizures   . Back pain   . Hypertension   . CHF (congestive heart failure)   . Depression     Past Surgical History  Procedure Laterality Date  . Brain surgery    . Breast lumpectomy    . Eardrum    . Ovarian cyst surgery    . Tonsillectomy    . Cholecystectomy    . Back surgery      Family History  Problem Relation Age of Onset  . Hypertension Mother   . Diabetes Mother   . Cancer Father   . Heart disease Father   . Hyperlipidemia Father   . Hypertension Father   . Hyperlipidemia Brother   . Hypertension Brother     History  Substance Use Topics  . Smoking status: Current Every Day Smoker    Types: Cigarettes  . Smokeless tobacco: Not on file  . Alcohol Use: No    Allergies:  Allergies  Allergen Reactions  . Bee Venom Anaphylaxis  .  Penicillins Anaphylaxis  . Toradol [Ketorolac Tromethamine] Anaphylaxis  . Latex Hives  . Morphine And Related Other (See Comments)    Causes Cramping.  Marland Kitchen Peach [Prunus Persica] Swelling  . Topamax [Topiramate] Hives and Nausea And Vomiting  . Tramadol Hives  . Vanilla Itching, Nausea And Vomiting and Swelling    If ingested it causes nausea and vomiting. If in topical products causes localized itching and swelling.    Prescriptions prior to admission  Medication Sig Dispense Refill  . albuterol (PROVENTIL HFA;VENTOLIN HFA) 108 (90 BASE) MCG/ACT inhaler Inhale 1-2 puffs into the lungs every 4 (four) hours as needed for wheezing or shortness of breath.  1 Inhaler  0  . esomeprazole (NEXIUM) 40 MG capsule Take 1 capsule (40 mg total) by mouth daily at 12 noon.  30 capsule  0  . FLUoxetine (PROZAC) 20 MG capsule Take 1 capsule (20 mg total) by mouth daily.  30 capsule  0  . hydrOXYzine (ATARAX/VISTARIL) 25 MG tablet Take 1 tablet (25 mg total) by mouth every 6 (six) hours as needed for anxiety.  14 tablet  0  . lisinopril (PRINIVIL,ZESTRIL) 40 MG tablet Take 1 tablet (40 mg total) by mouth daily.  30 tablet  0  . metoCLOPramide (REGLAN) 10 MG tablet Take 10 mg by mouth every 6 (six) hours as needed for nausea (nausea/headache).      . phenytoin (DILANTIN) 100 MG ER capsule Take 1 capsule (100 mg total) by mouth 3 (three) times daily.  90 capsule  1  . prazosin (MINIPRESS) 1 MG capsule Take 1 capsule (1 mg total) by mouth at bedtime.  30 capsule  0  . simvastatin (ZOCOR) 40 MG tablet Take 1 tablet (40 mg total) by mouth daily.  30 tablet  1  . traZODone (DESYREL) 100 MG tablet Take 1 tablet (100 mg total) by mouth at bedtime as needed for sleep.  7 tablet  0  . ciprofloxacin (CIPRO) 500 MG tablet Take 1 tablet (500 mg total) by mouth 2 (two) times daily.  28 tablet  0  . EPINEPHrine (EPIPEN) 0.3 mg/0.3 mL SOAJ injection Inject 0.3 mLs (0.3 mg total) into the muscle once.  1 Device  1  .  metroNIDAZOLE (FLAGYL) 500 MG tablet Take 1 tablet (500 mg total) by mouth 2 (two) times daily.  28 tablet  0  . oxyCODONE-acetaminophen (PERCOCET/ROXICET) 5-325 MG per tablet Take 1 tablet by mouth every 4 (four) hours as needed for severe pain.  10 tablet  0   Results for orders placed during the hospital encounter of 10/07/13 (from the past 48 hour(s))  URINALYSIS, ROUTINE W REFLEX MICROSCOPIC     Status: Abnormal   Collection Time    10/07/13  7:47 PM      Result Value Ref Range   Color, Urine YELLOW  YELLOW   APPearance CLEAR  CLEAR   Specific Gravity, Urine 1.017  1.005 - 1.030   pH 5.5  5.0 - 8.0   Glucose, UA NEGATIVE  NEGATIVE mg/dL   Hgb urine dipstick LARGE (*) NEGATIVE   Bilirubin Urine NEGATIVE  NEGATIVE   Ketones, ur NEGATIVE  NEGATIVE mg/dL   Protein, ur NEGATIVE  NEGATIVE mg/dL   Urobilinogen, UA 0.2  0.0 - 1.0 mg/dL   Nitrite NEGATIVE  NEGATIVE   Leukocytes, UA NEGATIVE  NEGATIVE  URINE MICROSCOPIC-ADD ON     Status: None   Collection Time    10/07/13  7:47 PM      Result Value Ref Range   WBC, UA 0-2  <3 WBC/hpf   RBC / HPF 11-20  <3 RBC/hpf  CBC WITH DIFFERENTIAL     Status: Abnormal   Collection Time    10/07/13  7:50 PM      Result Value Ref Range   WBC 8.3  4.0 - 10.5 K/uL   RBC 4.52  3.87 - 5.11 MIL/uL   Hemoglobin 14.2  12.0 - 15.0 g/dL   HCT 42.4  36.0 - 46.0 %   MCV 93.8  78.0 - 100.0 fL   MCH 31.4  26.0 - 34.0 pg   MCHC 33.5  30.0 - 36.0 g/dL   RDW 12.5  11.5 - 15.5 %   Platelets 229  150 - 400 K/uL   Neutrophils Relative % 58  43 - 77 %   Neutro Abs 4.8  1.7 - 7.7 K/uL   Lymphocytes Relative 31  12 - 46 %   Lymphs Abs 2.6  0.7 - 4.0 K/uL   Monocytes Relative 5  3 - 12 %   Monocytes Absolute 0.4  0.1 -  1.0 K/uL   Eosinophils Relative 6 (*) 0 - 5 %   Eosinophils Absolute 0.5  0.0 - 0.7 K/uL   Basophils Relative 1  0 - 1 %   Basophils Absolute 0.1  0.0 - 0.1 K/uL  COMPREHENSIVE METABOLIC PANEL     Status: Abnormal   Collection Time     10/07/13  7:50 PM      Result Value Ref Range   Sodium 137  137 - 147 mEq/L   Potassium 4.5  3.7 - 5.3 mEq/L   Chloride 103  96 - 112 mEq/L   CO2 24  19 - 32 mEq/L   Glucose, Bld 90  70 - 99 mg/dL   BUN 10  6 - 23 mg/dL   Creatinine, Ser 0.71  0.50 - 1.10 mg/dL   Calcium 11.2 (*) 8.4 - 10.5 mg/dL   Total Protein 7.5  6.0 - 8.3 g/dL   Albumin 3.8  3.5 - 5.2 g/dL   AST 17  0 - 37 U/L   Comment: SLIGHT HEMOLYSIS     HEMOLYSIS AT THIS LEVEL MAY AFFECT RESULT   ALT 19  0 - 35 U/L   Alkaline Phosphatase 140 (*) 39 - 117 U/L   Total Bilirubin 0.2 (*) 0.3 - 1.2 mg/dL   GFR calc non Af Amer >90  >90 mL/min   GFR calc Af Amer >90  >90 mL/min   Comment: (NOTE)     The eGFR has been calculated using the CKD EPI equation.     This calculation has not been validated in all clinical situations.     eGFR's persistently <90 mL/min signify possible Chronic Kidney     Disease.  WET PREP, GENITAL     Status: Abnormal   Collection Time    10/07/13 11:30 PM      Result Value Ref Range   Yeast Wet Prep HPF POC NONE SEEN  NONE SEEN   Trich, Wet Prep NONE SEEN  NONE SEEN   Clue Cells Wet Prep HPF POC NONE SEEN  NONE SEEN   WBC, Wet Prep HPF POC RARE (*) NONE SEEN   Comment: FEW BACTERIA SEEN   US Transvaginal Non-ob  10/07/2013   CLINICAL DATA:  Left lower quadrant pain.  EXAM: TRANSVAGINAL ULTRASOUND OF PELVIS  TECHNIQUE: Transvaginal ultrasound examination of the pelvis was performed including evaluation of the uterus, ovaries, adnexal regions, and pelvic cul-de-sac.  COMPARISON:  None.  FINDINGS: Uterus  Measurements: 7.1 x 3.5 x 5.4 cm. No fibroids or other mass visualized.  Endometrium  Thickness: 4 mm.  No focal abnormality visualized.  Right ovary  Measurements: 3.5 x 2.5 x 3.6 cm. Normal appearance/no adnexal mass.  Left ovary  Measurements:  3.0x2.2x2.7 cm. Normal appearance/no adnexal mass.  Other findings: No free fluid. A large 8.6 x 3.8 cm pelvic cystic mass is noted. This corresponds to  recent CT findings. This could be related to severe hydrosalpinx. A cystic count adnexal/ovarian malignancy cannot be excluded.  No evidence of torsion.  IMPRESSION: Large cystic pelvic mass measuring 8.6 x 3.8 cm posterior to the uterus. Similar findings noted on recent CT. Severe hydrosalpinx could present this fashion. Benign or malignant cystic ovarian tumor could present in this fashion.   Electronically Signed   By: Marcello Moores  Register   On: 10/07/2013 23:44   Review of Systems  Constitutional: Negative for fever and chills.  Gastrointestinal: Positive for nausea and abdominal pain (+Left-mid lower abdominal pain; sharp and constant. ). Negative for  vomiting, diarrhea and constipation.  Musculoskeletal: Positive for back pain.  Psychiatric/Behavioral: Negative for depression.   Physical Exam   Blood pressure 136/63, pulse 60, temperature 98.1 F (36.7 C), temperature source Oral, resp. rate 20, height 5' 7.5" (1.715 m), weight 117.935 kg (260 lb), last menstrual period 10/05/2013, SpO2 96.00%.  Physical Exam  Constitutional: She appears well-developed.  HENT:  Head: Normocephalic.  Mouth/Throat: Dental caries present.  Eyes: Pupils are equal, round, and reactive to light.  Neck: Normal range of motion.  GI: Soft. There is tenderness (Difficult to examine abdomen due to patients discomfort ) in the right lower quadrant, suprapubic area and left lower quadrant. There is rigidity and guarding.  Genitourinary:  Speculum exam: Limited exam due to patients intolerance to exam  Vagina - Small amount of brown, clear discharge, no odor Cervix - No contact bleeding Bimanual exam: Cervix closed, + CMT Uterus tender, enlarged, mass felt posterior  Unable to palpate adnexa due to patients discomfort  GC/Chlam, wet prep done Chaperone present for exam.   Psychiatric: Her mood appears anxious. Her affect is angry, blunt and inappropriate. She is agitated and hyperactive. She is not actively  hallucinating. She expresses no homicidal and no suicidal ideation. She expresses no suicidal plans and no homicidal plans.    MAU Course  Procedures None   MDM Pt transferred from [facility]; plan of care discussed with Dr. Kennon Rounds.  Pt in [place] and states that she cannot complete the [place] unless she gets something else for pain. Fentanyl 100 mg IV given in [place].  1130: pt rates her pain 10/10 Discussed US findings with Dr. Kennon Rounds;  Admission to the hospital is recommended  -Discussed admission to [facility] hospital as our recommended for further treatment/ ? MRI. Pt declined admission at this time due to family emergency in [place]. Risks discussed regarding leaving, I informed the patient that further testing/ treatment was warranted and if she left if would be AMA. Pt agreed to take Azithromycin prior to discharge (unable to given rocephin due to allergy; anaphylaxis to PCN) I will prescribed Doxycycline and patient agrees to fill RX.  I will give the patient another RX for pain medication due to her not filling the last one; stating that she lost the RX.  Azithromycin 1 gram given PO.  Patient states she cannot take vicodin and requests percocet RX  RX of flagyl written by Dr. Ihor Dow; pt encouraged to fill RX  Wet prep GC pending CBC   Assessment and Plan   A:  1. Unspecified symptom associated with female genital organs   2. Hydrosalpinx   3. Ovarian mass    P;  Patient left AMA  RX: Doxycycline        Percocet # 10 with No refills. Pt encouraged to follow up with her Dr. In Herbie Saxon, NP  10/08/2013, 8:28 AM

## 2013-10-07 NOTE — ED Provider Notes (Signed)
CSN: [id number]     Arrival date & time 10/07/13  1845 History   First MD Initiated Contact with Patient 10/07/13 1856     Chief Complaint  Patient presents with  . Abdominal Pain     (Consider location/radiation/quality/duration/timing/severity/associated sxs/prior Treatment) Patient is a 38 y.o. female presenting with abdominal pain. The history is provided by the patient.  Abdominal Pain  patient here complaining of worsening abdominal pain. Was admitted to the hospital 5 days ago for PID and possible tubo-ovarian abscess. She left the hospital against the advice after she would not consent to having an MRI performed. She was prescribed antibiotics and did not take them. Her pain initially was on the right side of her lower abdomen and is now migrated to the left side. Denies any fever or chills. Pain is been persistent. She notes that she is currently on her menstrual cycle. Denies any urinary symptoms. Use over-the-counter medications without relief. Pain characterized as sharp and worse with walking or movement.  Past Medical History  Diagnosis Date  . Brain tumor   . Seizures   . Back pain   . Hypertension   . CHF (congestive heart failure)   . Depression    Past Surgical History  Procedure Laterality Date  . Brain surgery     Family History  Problem Relation Age of Onset  . Hypertension Mother   . Diabetes Mother   . Cancer Father   . Heart disease Father   . Hyperlipidemia Father   . Hypertension Father   . Hyperlipidemia Brother   . Hypertension Brother    History  Substance Use Topics  . Smoking status: Current Every Day Smoker    Types: Cigarettes  . Smokeless tobacco: Not on file  . Alcohol Use: No   OB History   Grav Para Term Preterm Abortions TAB SAB Ect Mult Living   2 2 2       2      Review of Systems  Gastrointestinal: Positive for abdominal pain.  All other systems reviewed and are negative.      Allergies  Bee venom; Penicillins; Toradol;  Latex; Morphine and related; Peach; Topamax; Tramadol; and Vanilla  Home Medications   Current Outpatient Rx  Name  Route  Sig  Dispense  Refill  . albuterol (PROVENTIL HFA;VENTOLIN HFA) 108 (90 BASE) MCG/ACT inhaler   Inhalation   Inhale 1-2 puffs into the lungs every 4 (four) hours as needed for wheezing or shortness of breath.   1 Inhaler   0   . ciprofloxacin (CIPRO) 500 MG tablet   Oral   Take 1 tablet (500 mg total) by mouth 2 (two) times daily.   28 tablet   0   . EPINEPHrine (EPIPEN) 0.3 mg/0.3 mL SOAJ injection   Intramuscular   Inject 0.3 mLs (0.3 mg total) into the muscle once.   1 Device   1   . esomeprazole (NEXIUM) 40 MG capsule   Oral   Take 1 capsule (40 mg total) by mouth daily at 12 noon.   30 capsule   0   . FLUoxetine (PROZAC) 20 MG capsule   Oral   Take 1 capsule (20 mg total) by mouth daily.   30 capsule   0   . hydrOXYzine (ATARAX/VISTARIL) 25 MG tablet   Oral   Take 1 tablet (25 mg total) by mouth every 6 (six) hours as needed for anxiety.   14 tablet   0   . lisinopril (  PRINIVIL,ZESTRIL) 40 MG tablet   Oral   Take 1 tablet (40 mg total) by mouth daily.   30 tablet   0   . metoCLOPramide (REGLAN) 10 MG tablet   Oral   Take 10 mg by mouth every 6 (six) hours as needed for nausea (nausea/headache).         . metroNIDAZOLE (FLAGYL) 500 MG tablet   Oral   Take 1 tablet (500 mg total) by mouth 2 (two) times daily.   28 tablet   0   . oxyCODONE-acetaminophen (PERCOCET/ROXICET) 5-325 MG per tablet   Oral   Take 1 tablet by mouth every 4 (four) hours as needed for severe pain.   10 tablet   0   . oxyCODONE-acetaminophen (PERCOCET/ROXICET) 5-325 MG per tablet   Oral   Take 1-2 tablets by mouth every 3 (three) hours as needed for severe pain (moderate to severe pain (when tolerating fluids)).   30 tablet   0   . phenytoin (DILANTIN) 100 MG ER capsule   Oral   Take 1 capsule (100 mg total) by mouth 3 (three) times daily.   90  capsule   1   . prazosin (MINIPRESS) 1 MG capsule   Oral   Take 1 capsule (1 mg total) by mouth at bedtime.   30 capsule   0   . simvastatin (ZOCOR) 40 MG tablet   Oral   Take 1 tablet (40 mg total) by mouth daily.   30 tablet   1   . traZODone (DESYREL) 100 MG tablet   Oral   Take 1 tablet (100 mg total) by mouth at bedtime as needed for sleep.   7 tablet   0    BP 163/123  Pulse 74  Temp(Src) 97.5 F (36.4 C) (Oral)  Resp 16  Ht 5\' 7"  (1.702 m)  Wt 260 lb (117.935 kg)  BMI 40.71 kg/m2  SpO2 97%  LMP 09/03/2013 Physical Exam  Nursing note and vitals reviewed. Constitutional: She is oriented to person, place, and time. She appears well-developed and well-nourished.  Non-toxic appearance. No distress.  HENT:  Head: Normocephalic and atraumatic.  Eyes: Conjunctivae, EOM and lids are normal. Pupils are equal, round, and reactive to light.  Neck: Normal range of motion. Neck supple. No tracheal deviation present. No mass present.  Cardiovascular: Normal rate, regular rhythm and normal heart sounds.  Exam reveals no gallop.   No murmur heard. Pulmonary/Chest: Effort normal and breath sounds normal. No stridor. No respiratory distress. She has no decreased breath sounds. She has no wheezes. She has no rhonchi. She has no rales.  Abdominal: Soft. Normal appearance and bowel sounds are normal. She exhibits no distension. There is tenderness in the right lower quadrant and left lower quadrant. There is guarding. There is no rebound and no CVA tenderness.    Musculoskeletal: Normal range of motion. She exhibits no edema and no tenderness.  Neurological: She is alert and oriented to person, place, and time. She has normal strength. No cranial nerve deficit or sensory deficit. GCS eye subscore is 4. GCS verbal subscore is 5. GCS motor subscore is 6.  Skin: Skin is warm and dry. No abrasion and no rash noted.  Psychiatric: She has a normal mood and affect. Her speech is normal and  behavior is normal.    ED Course  Procedures (including critical care time) Labs Review Labs Reviewed  CBC WITH DIFFERENTIAL  COMPREHENSIVE METABOLIC PANEL   Imaging Review No results found.  EKG Interpretation None      MDM   Final diagnoses:  None    Spoke with dr. Kennon Rounds, will transfer to [facility]    Leota Jacobsen, MD 10/07/13 [phone]

## 2013-10-07 NOTE — ED Notes (Signed)
Report to [facility] for transport to [facility]

## 2013-10-08 MED ORDER — OXYCODONE-ACETAMINOPHEN 5-325 MG PO TABS
1.0000 | ORAL_TABLET | ORAL | Status: AC | PRN
Start: 2013-10-08 — End: ?

## 2013-10-08 MED ORDER — HYDROCODONE-ACETAMINOPHEN 5-325 MG PO TABS: 1.0000 | ORAL_TABLET | ORAL | Status: DC | PRN

## 2013-10-08 MED ORDER — DOXYCYCLINE HYCLATE 100 MG PO CAPS: 100.0000 mg | ORAL_CAPSULE | Freq: Two times a day (BID) | ORAL | Status: AC

## 2013-10-09 LAB — GC/CHLAMYDIA PROBE AMP
CT Probe RNA: NEGATIVE
GC Probe RNA: NEGATIVE

## 2013-10-09 NOTE — [facility] Provider Note (Signed)
Attestation of Attending Supervision of Advanced Practitioner (PA/CNM/NP): Evaluation and management procedures were performed by the Advanced Practitioner under my supervision and collaboration.  I have reviewed the Advanced Practitioner's note and chart, and I agree with the management and plan.  Donnamae Jude, MD [facility] Attending 10/09/2013 8:54 AM

## 2014-05-07 ENCOUNTER — Encounter (HOSPITAL_COMMUNITY): Payer: Self-pay | Admitting: Emergency Medicine

## 2015-11-26 IMAGING — CT CT HEAD W/O CM
2 series · 16 of 30 positions shown, 18 images · non-contrast
Comparison: none

[Series 2: head w/o · axial · non-contrast · 0.49mm/px · z∈[+73,+193]mm · 8 of 32 slices shown, 10 images]
[im 4/32  brain]
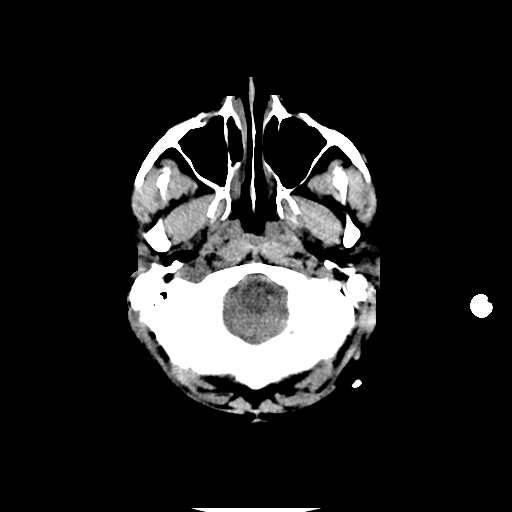
[im 4/32  bone]
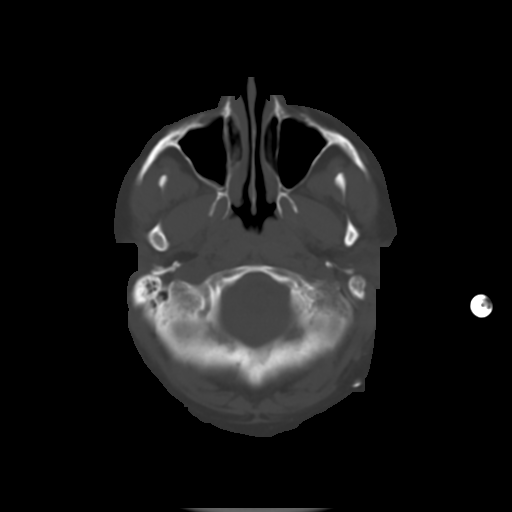
[im 7/32  brain]
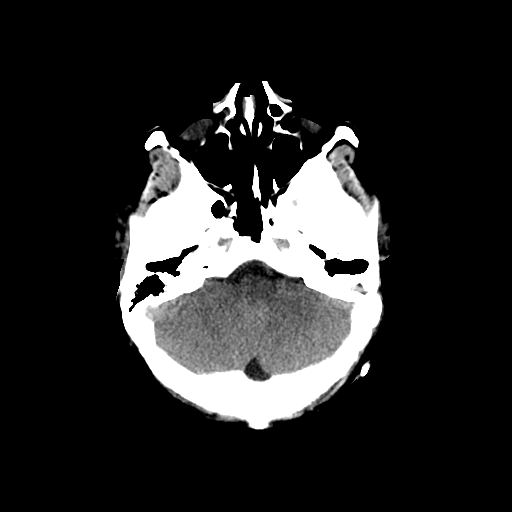
[im 11/32  brain]
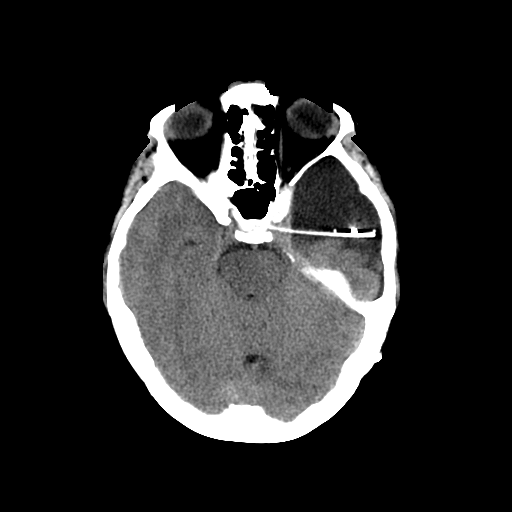
[im 14/32  brain]
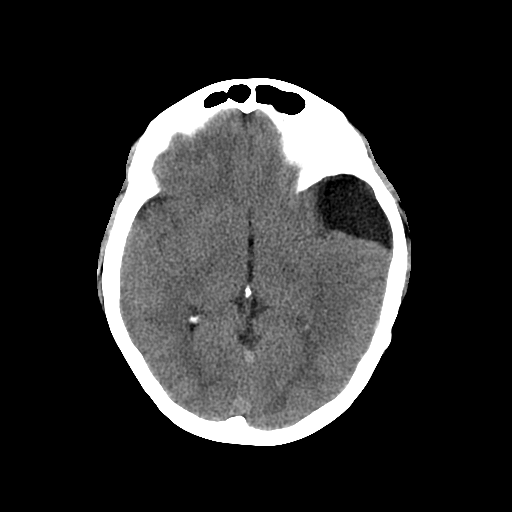
[im 18/32  brain]
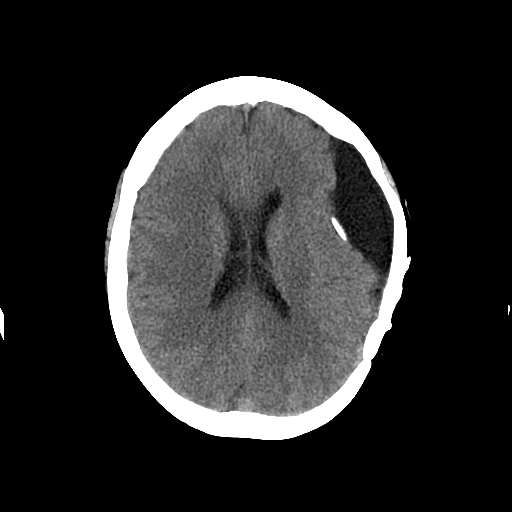
[im 18/32  bone]
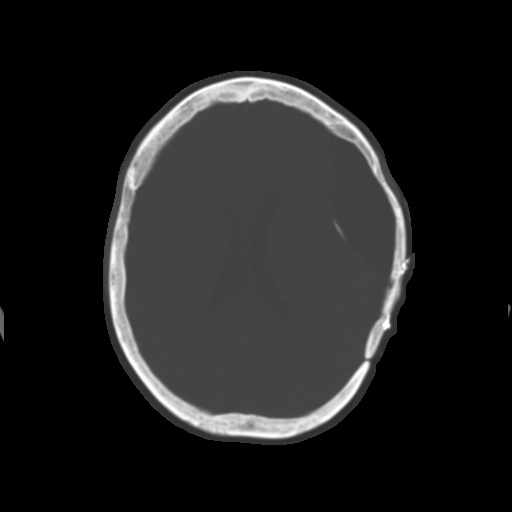
[im 21/32  brain]
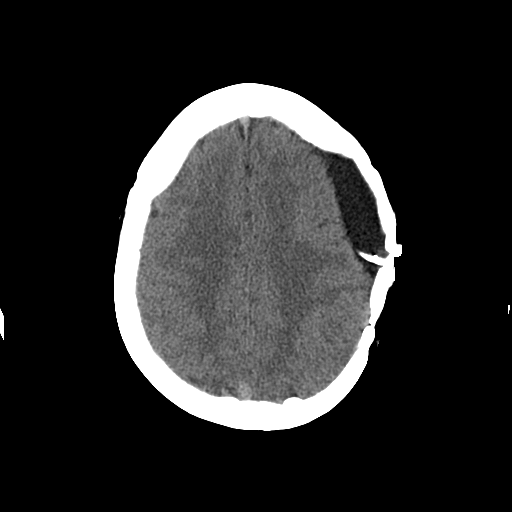
[im 25/32  brain]
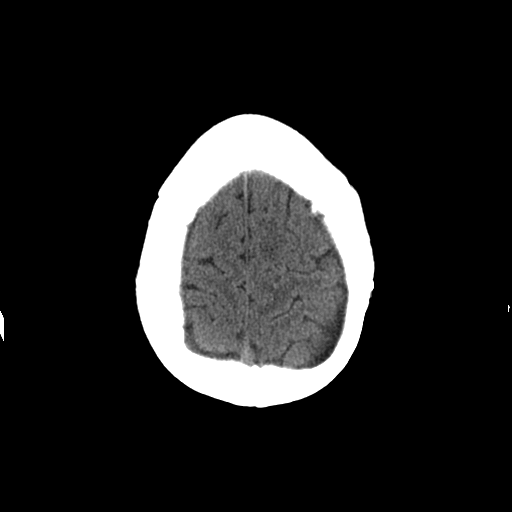
[im 28/32  brain]
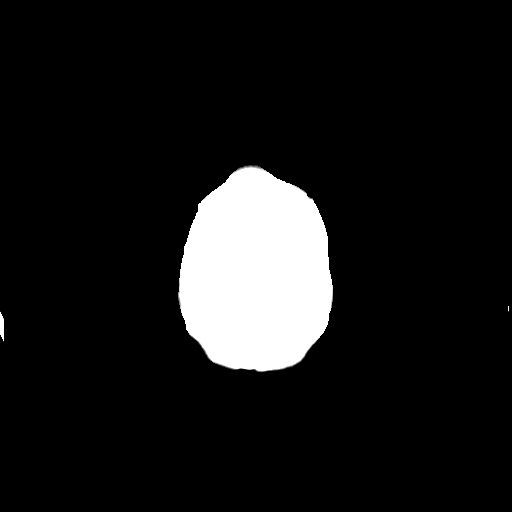

[Series 3: head w/o bone · axial · non-contrast · 0.49mm/px · z∈[+73,+196]mm · 8 of 63 slices shown]
[im 7/63  bone]
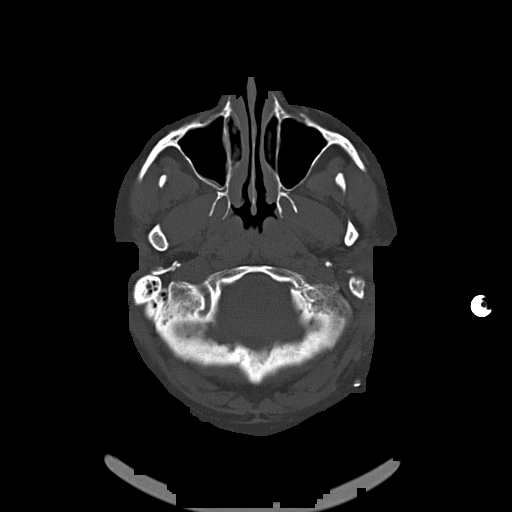
[im 14/63  bone]
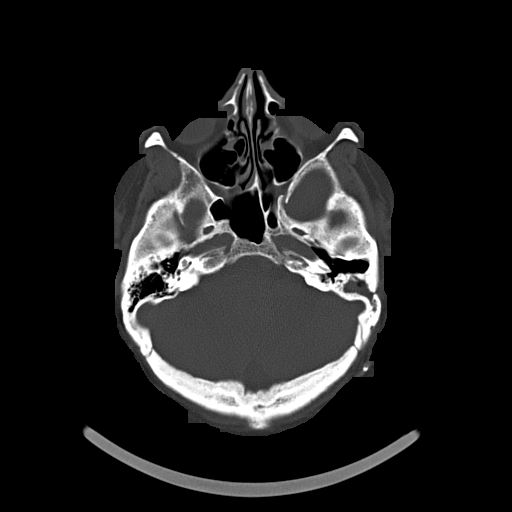
[im 20/63  bone]
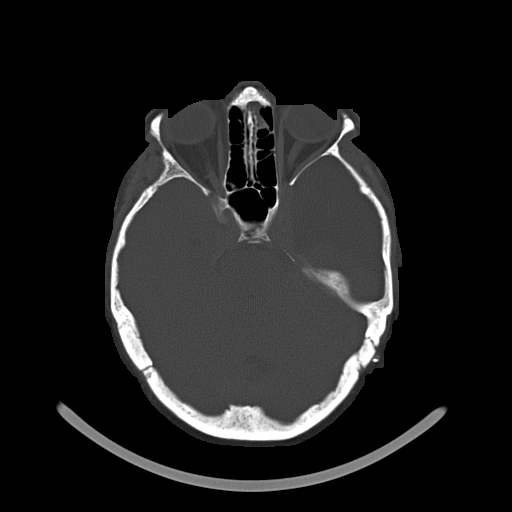
[im 27/63  bone]
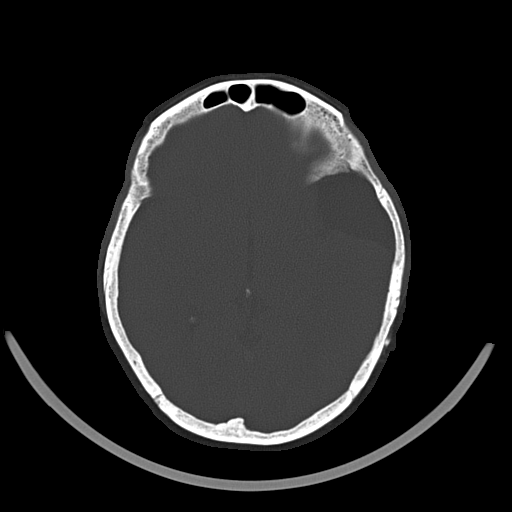
[im 36/63  bone]
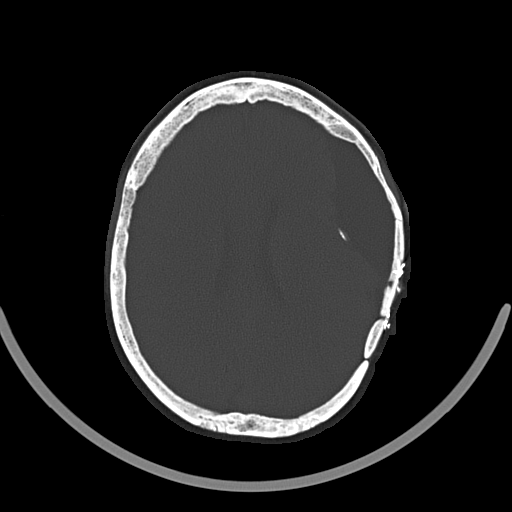
[im 43/63  bone]
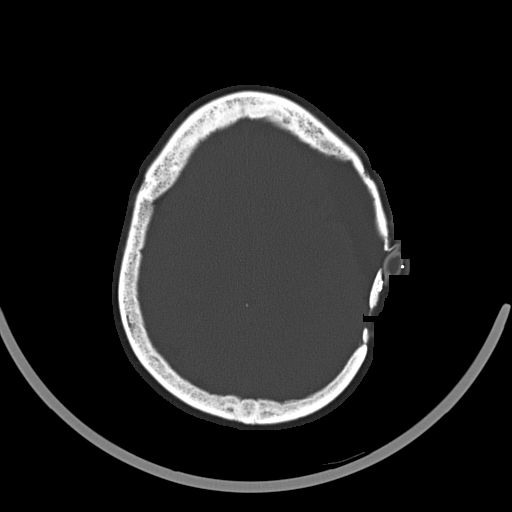
[im 49/63  bone]
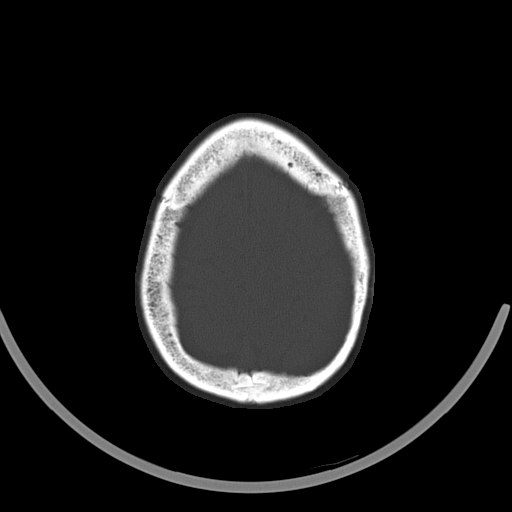
[im 56/63  bone]
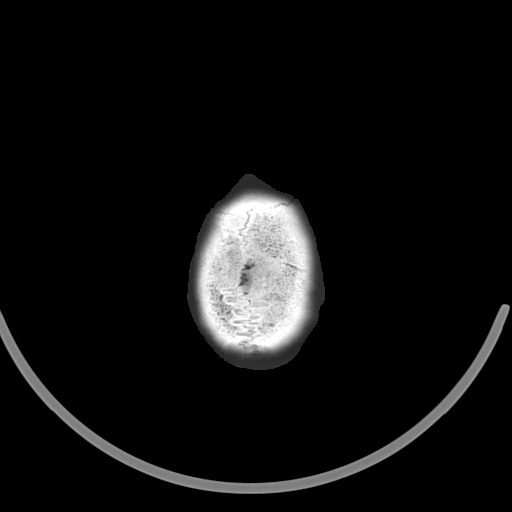

[16 of 30 positions shown; findings below may reference images not displayed]

CLINICAL DATA
Brain tumor, head pressure, intermittent disorientation/speech
issues

EXAM
CT HEAD WITHOUT CONTRAST

TECHNIQUE
Contiguous axial images were obtained from the base of the skull
through the vertex without intravenous contrast.

COMPARISON
None.

FINDINGS
Fluid density lesion overlying the lateral aspect of the left
frontal lobe with mass effect on the anterior aspect of the temporal
lobe. This appearance is most suggestive of an arachnoid cyst,
although the postsurgical extra-axial collection related to prior
resection is theoretically also possible. Associated indwelling
shunt catheter. Additional sacrificed catheter fragment in the left
middle cranial fossa (series 2/image 11).

Overlying postsurgical changes related to prior left frontoparietal
craniotomy. Associated left mastoidectomy.

Mass effect on the underlying left frontal lobe.  No midline shift.

No CT evidence of acute infarction.

Cerebral volume is within normal limits. No ventriculomegaly.

Minimal/partial opacification of the left ethmoid sinus. Visualized
paranasal sinuses and right mastoid air cells are otherwise clear.

IMPRESSION
Fluid density lesion overlying the left frontotemporal region,
favored to reflect an arachnoid cyst. Correlate with clinical
history.

Associated indwelling shunt catheter. Additional sacrificed catheter
fragment in the left middle cranial fossa.

No evidence of acute intracranial abnormality.

SIGNATURE

## 2015-11-28 IMAGING — US US PELVIS COMPLETE
1 series · 14 of 25 positions shown · non-contrast
Comparison: None

CLINICAL DATA: Vaginal bleeding

EXAM:
TRANSABDOMINAL AND TRANSVAGINAL ULTRASOUND OF PELVIS
TECHNIQUE: Both transabdominal and transvaginal ultrasound examinations of the
pelvis were performed. Transabdominal technique was performed for
global imaging of the pelvis including uterus, ovaries, adnexal
regions, and pelvic cul-de-sac. It was necessary to proceed with
endovaginal exam following the transabdominal exam to visualize the
uterus and adnexae in better detail.

[Series 1: us pelvis limited · 68 acquisitions, 14 frames shown]
[im 1/68]
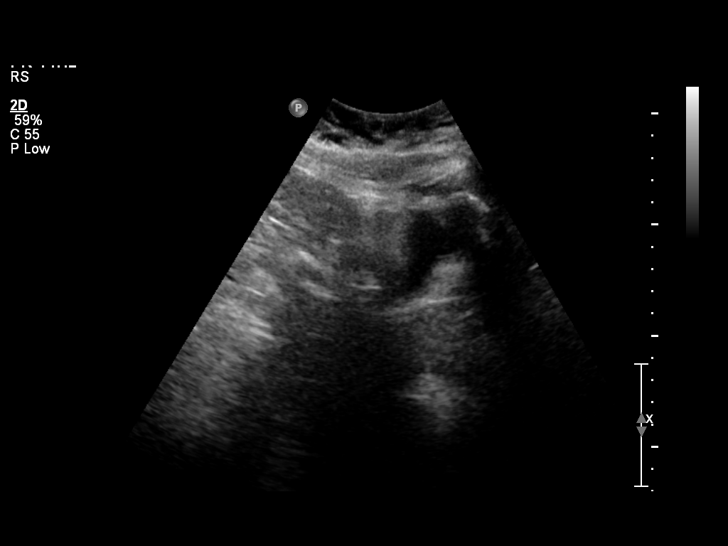
[im 6/68]
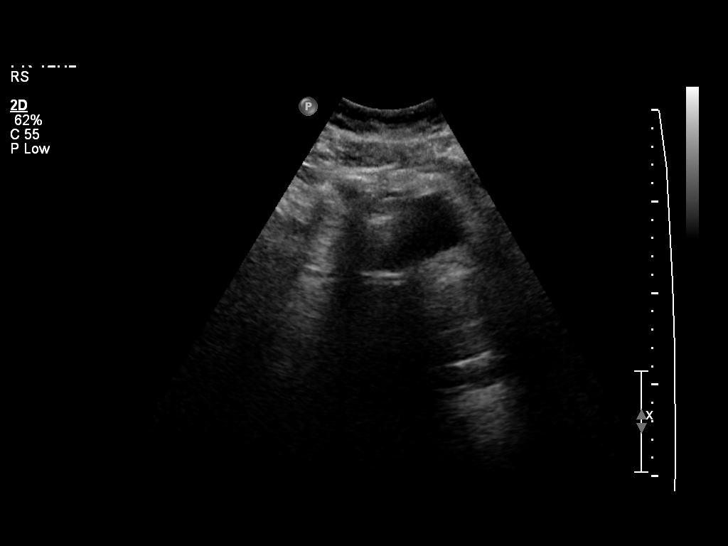
[im 12/68]
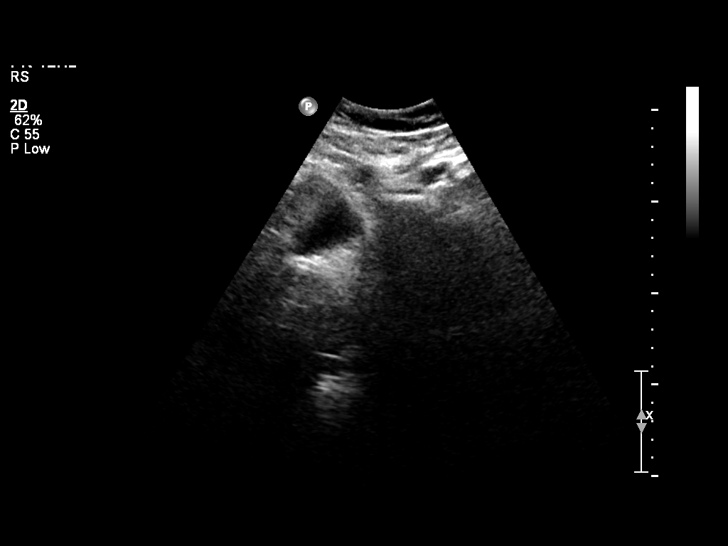
[im 17/68]
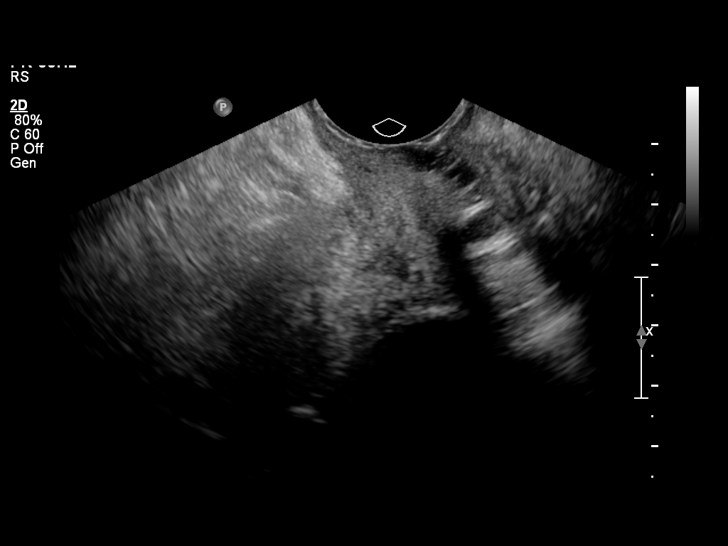
[im 23/68]
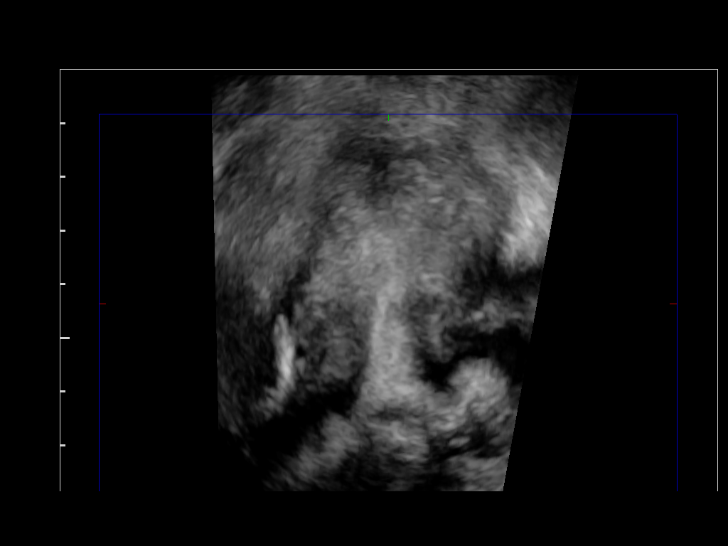
[im 26/68]
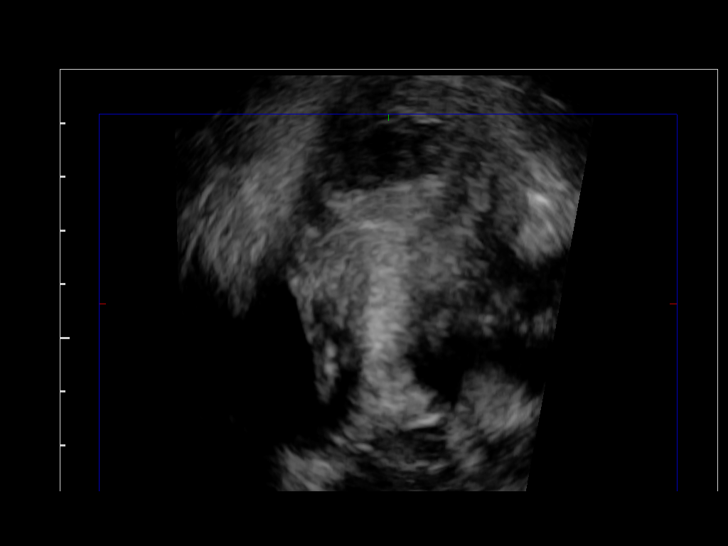
[im 31/68]
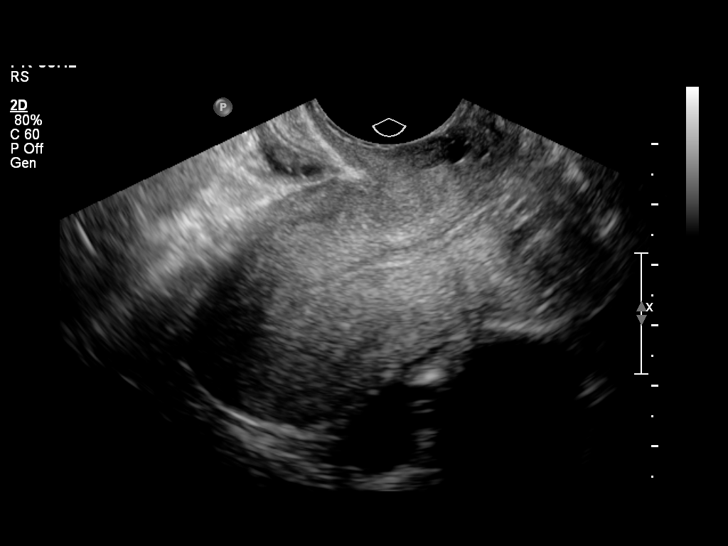
[im 37/68]
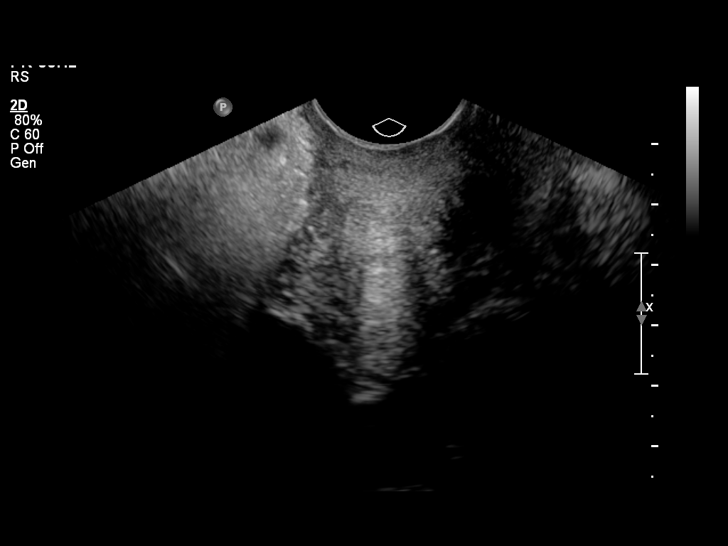
[im 42/68]
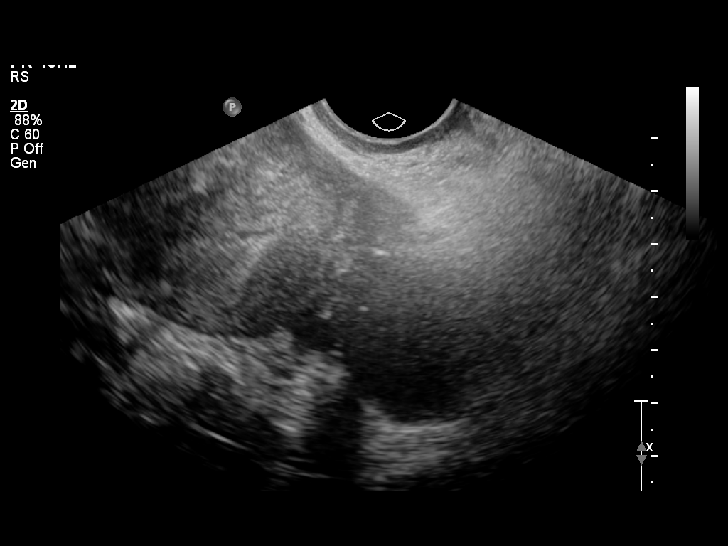
[im 45/68]
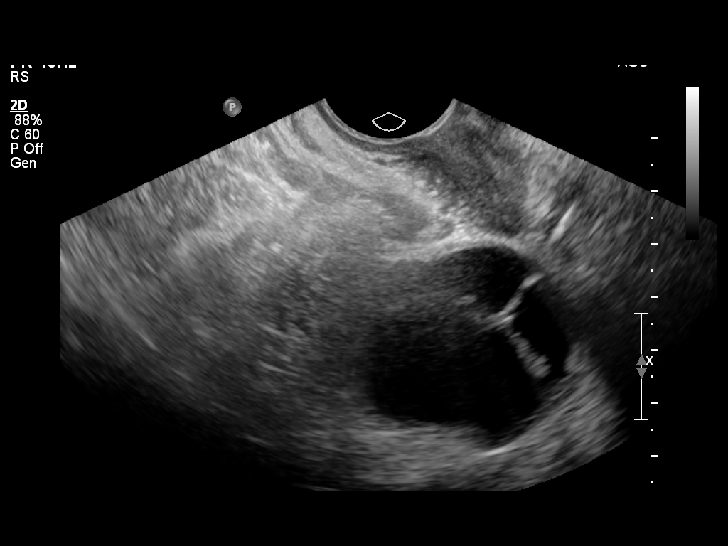
[im 51/68]
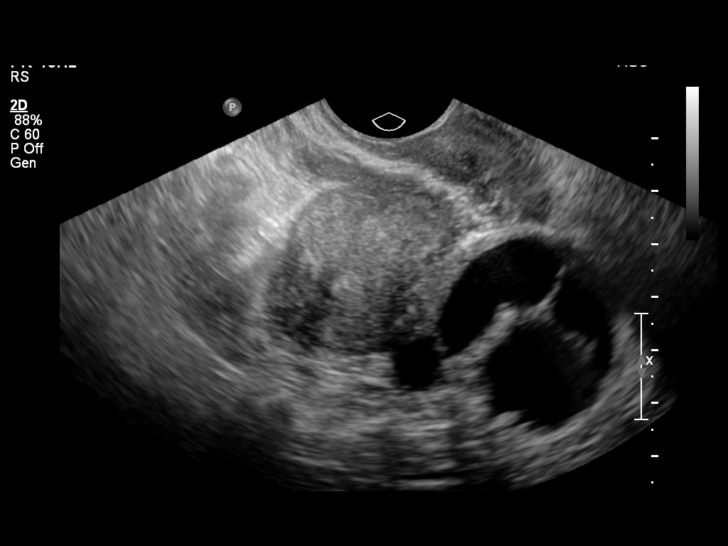
[im 56/68]
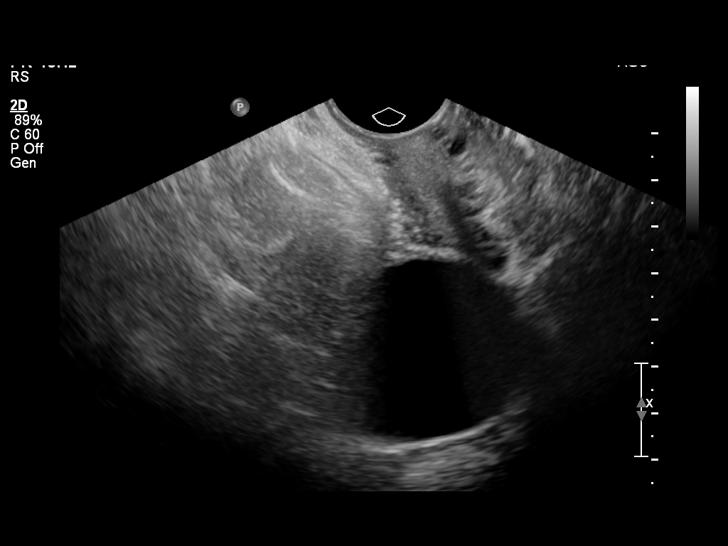
[im 62/68]
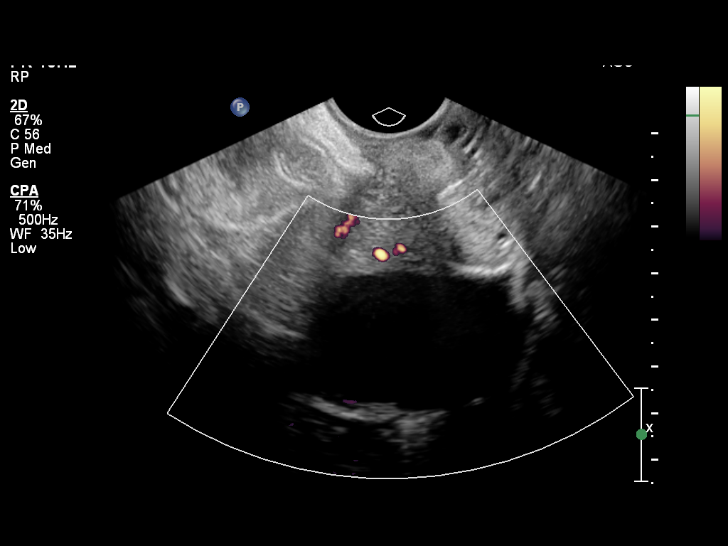
[im 68/68]
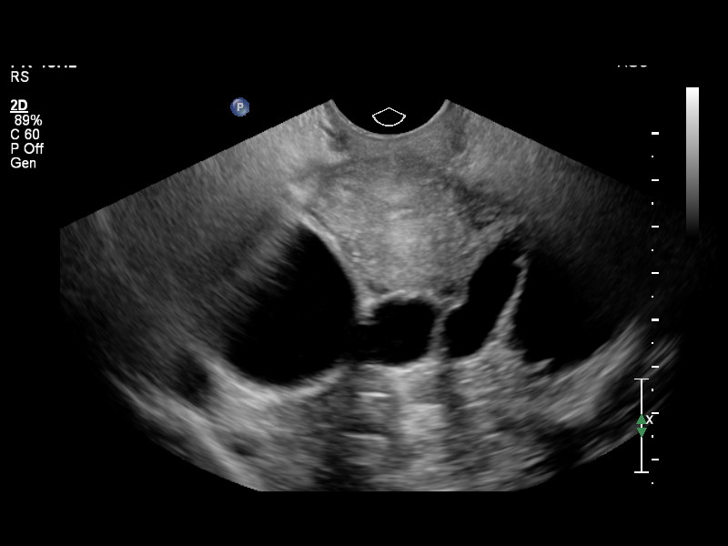

[14 of 25 positions shown; findings below may reference images not displayed]

FINDINGS: Uterus

Measurements: 7.0 x 3.3 x 4.0 cm. No fibroids or other mass
visualized. Normal appearance by ultrasound.

Endometrium

Thickness: 3.4 mm.  No focal abnormality visualized.

Right ovary

Not visualized

Left ovary

Not visualized

Other findings

Left adnexa dilated tubular structure noted which appears to extend
across the midline, nonspecific in appearance but suggests a
hydrosalpinx.

No dependent free fluid.
IMPRESSION: Normal uterus and endometrium

Nonvisualization ovaries

Dilated fluid-filled tubular structure within the pelvis, suspect
hydrosalpinx.
